# Patient Record
Sex: Male | Born: 1955 | Race: Black or African American | Hispanic: No | Marital: Single | Smoking: Former smoker
Health system: Southern US, Community
[De-identification: ages and names within clinical notes are randomized; demographics above are authoritative.]

## PROBLEM LIST (undated history)

## (undated) DIAGNOSIS — I480 Paroxysmal atrial fibrillation: Secondary | ICD-10-CM

## (undated) DIAGNOSIS — I5032 Chronic diastolic (congestive) heart failure: Secondary | ICD-10-CM

## (undated) DIAGNOSIS — Z952 Presence of prosthetic heart valve: Secondary | ICD-10-CM

## (undated) DIAGNOSIS — I82409 Acute embolism and thrombosis of unspecified deep veins of unspecified lower extremity: Secondary | ICD-10-CM

## (undated) DIAGNOSIS — N400 Enlarged prostate without lower urinary tract symptoms: Secondary | ICD-10-CM

## (undated) DIAGNOSIS — Z8679 Personal history of other diseases of the circulatory system: Secondary | ICD-10-CM

## (undated) DIAGNOSIS — Z794 Long term (current) use of insulin: Secondary | ICD-10-CM

## (undated) DIAGNOSIS — I639 Cerebral infarction, unspecified: Secondary | ICD-10-CM

## (undated) DIAGNOSIS — IMO0001 Reserved for inherently not codable concepts without codable children: Secondary | ICD-10-CM

## (undated) DIAGNOSIS — E119 Type 2 diabetes mellitus without complications: Secondary | ICD-10-CM

## (undated) DIAGNOSIS — N189 Chronic kidney disease, unspecified: Secondary | ICD-10-CM

---

## 2017-10-31 DIAGNOSIS — I639 Cerebral infarction, unspecified: Secondary | ICD-10-CM

## 2017-10-31 HISTORY — DX: Cerebral infarction, unspecified: I63.9

## 2017-10-31 HISTORY — PX: AORTIC VALVE REPLACEMENT: SHX41

## 2017-10-31 HISTORY — PX: THORACOABDOMINAL AORTIC ANEURYSM REPAIR: SHX2504

## 2018-08-07 ENCOUNTER — Encounter (HOSPITAL_COMMUNITY): Payer: Self-pay | Admitting: *Deleted

## 2018-08-07 ENCOUNTER — Emergency Department (HOSPITAL_COMMUNITY)
Admission: EM | Admit: 2018-08-07 | Discharge: 2018-08-07 | Disposition: A | Discharge status: Home / Self Care | Payer: Medicaid Other | Attending: Emergency Medicine | Admitting: Emergency Medicine

## 2018-08-07 ENCOUNTER — Emergency Department (HOSPITAL_COMMUNITY): Payer: Medicaid Other

## 2018-08-07 DIAGNOSIS — Z8673 Personal history of transient ischemic attack (TIA), and cerebral infarction without residual deficits: Secondary | ICD-10-CM | POA: Diagnosis not present

## 2018-08-07 DIAGNOSIS — Z7901 Long term (current) use of anticoagulants: Secondary | ICD-10-CM | POA: Insufficient documentation

## 2018-08-07 DIAGNOSIS — R42 Dizziness and giddiness: Secondary | ICD-10-CM | POA: Diagnosis present

## 2018-08-07 DIAGNOSIS — Z7982 Long term (current) use of aspirin: Secondary | ICD-10-CM | POA: Diagnosis not present

## 2018-08-07 DIAGNOSIS — N189 Chronic kidney disease, unspecified: Secondary | ICD-10-CM

## 2018-08-07 DIAGNOSIS — Z794 Long term (current) use of insulin: Secondary | ICD-10-CM | POA: Insufficient documentation

## 2018-08-07 DIAGNOSIS — Z79899 Other long term (current) drug therapy: Secondary | ICD-10-CM | POA: Insufficient documentation

## 2018-08-07 DIAGNOSIS — R252 Cramp and spasm: Secondary | ICD-10-CM | POA: Diagnosis not present

## 2018-08-07 DIAGNOSIS — R739 Hyperglycemia, unspecified: Secondary | ICD-10-CM

## 2018-08-07 DIAGNOSIS — E1165 Type 2 diabetes mellitus with hyperglycemia: Secondary | ICD-10-CM | POA: Diagnosis not present

## 2018-08-07 HISTORY — DX: Benign prostatic hyperplasia without lower urinary tract symptoms: N40.0

## 2018-08-07 HISTORY — DX: Type 2 diabetes mellitus without complications: E11.9

## 2018-08-07 HISTORY — DX: Long term (current) use of insulin: Z79.4

## 2018-08-07 HISTORY — DX: Chronic kidney disease, unspecified: N18.9

## 2018-08-07 HISTORY — DX: Reserved for inherently not codable concepts without codable children: IMO0001

## 2018-08-07 HISTORY — DX: Cerebral infarction, unspecified: I63.9

## 2018-08-07 LAB — I-STAT TROPONIN, ED: Troponin i, poc: 0.02 ng/mL (ref 0.00–0.08)

## 2018-08-07 LAB — CBC
HCT: 41.1 % (ref 39.0–52.0)
Hemoglobin: 12.6 g/dL — ABNORMAL LOW (ref 13.0–17.0)
MCH: 25.7 pg — ABNORMAL LOW (ref 26.0–34.0)
MCHC: 30.7 g/dL (ref 30.0–36.0)
MCV: 83.9 fL (ref 80.0–100.0)
Platelets: 214 10*3/uL (ref 150–400)
RBC: 4.9 MIL/uL (ref 4.22–5.81)
RDW: 12.8 % (ref 11.5–15.5)
WBC: 7.5 10*3/uL (ref 4.0–10.5)
nRBC: 0 % (ref 0.0–0.2)

## 2018-08-07 LAB — COMPREHENSIVE METABOLIC PANEL
ALT: 43 U/L (ref 0–44)
AST: 38 U/L (ref 15–41)
Albumin: 4.2 g/dL (ref 3.5–5.0)
Alkaline Phosphatase: 70 U/L (ref 38–126)
Anion gap: 12 (ref 5–15)
BUN: 34 mg/dL — ABNORMAL HIGH (ref 6–20)
CALCIUM: 9.8 mg/dL (ref 8.9–10.3)
CO2: 21 mmol/L — ABNORMAL LOW (ref 22–32)
CREATININE: 2.29 mg/dL — AB (ref 0.61–1.24)
Chloride: 94 mmol/L — ABNORMAL LOW (ref 98–111)
GFR calc Af Amer: 35 mL/min — ABNORMAL LOW (ref >60)
GFR, EST NON AFRICAN AMERICAN: 30 mL/min — AB (ref >60)
Glucose, Bld: 604 mg/dL (ref 70–99)
Potassium: 5.7 mmol/L — ABNORMAL HIGH (ref 3.5–5.1)
Sodium: 127 mmol/L — ABNORMAL LOW (ref 135–145)
Total Bilirubin: 1.2 mg/dL (ref 0.3–1.2)
Total Protein: 7.9 g/dL (ref 6.5–8.1)

## 2018-08-07 LAB — I-STAT VENOUS BLOOD GAS, ED
ACID-BASE EXCESS: 1 mmol/L (ref 0.0–2.0)
Bicarbonate: 28.2 mmol/L — ABNORMAL HIGH (ref 20.0–28.0)
O2 Saturation: 83 %
TCO2: 30 mmol/L (ref 22–32)
pCO2, Ven: 54.1 mmHg (ref 44.0–60.0)
pH, Ven: 7.325 (ref 7.250–7.430)
pO2, Ven: 52 mmHg — ABNORMAL HIGH (ref 32.0–45.0)

## 2018-08-07 LAB — DIFFERENTIAL
Abs Immature Granulocytes: 0.02 10*3/uL (ref 0.00–0.07)
Basophils Absolute: 0 10*3/uL (ref 0.0–0.1)
Basophils Relative: 0 %
Eosinophils Absolute: 0.2 10*3/uL (ref 0.0–0.5)
Eosinophils Relative: 2 %
Immature Granulocytes: 0 %
Lymphocytes Relative: 23 %
Lymphs Abs: 1.7 10*3/uL (ref 0.7–4.0)
Monocytes Absolute: 0.7 10*3/uL (ref 0.1–1.0)
Monocytes Relative: 9 %
Neutro Abs: 4.8 10*3/uL (ref 1.7–7.7)
Neutrophils Relative %: 66 %

## 2018-08-07 LAB — I-STAT CHEM 8, ED
BUN: 36 mg/dL — AB (ref 6–20)
BUN: 30 mg/dL — ABNORMAL HIGH (ref 6–20)
CREATININE: 2.1 mg/dL — AB (ref 0.61–1.24)
Calcium, Ion: 1.25 mmol/L (ref 1.15–1.40)
Calcium, Ion: 1.28 mmol/L (ref 1.15–1.40)
Chloride: 97 mmol/L — ABNORMAL LOW (ref 98–111)
Chloride: 101 mmol/L (ref 98–111)
Creatinine, Ser: 1.9 mg/dL — ABNORMAL HIGH (ref 0.61–1.24)
Glucose, Bld: 642 mg/dL (ref 70–99)
Glucose, Bld: 234 mg/dL — ABNORMAL HIGH (ref 70–99)
HCT: 44 % (ref 39.0–52.0)
HCT: 39 % (ref 39.0–52.0)
Hemoglobin: 15 g/dL (ref 13.0–17.0)
Hemoglobin: 13.3 g/dL (ref 13.0–17.0)
Potassium: 5.5 mmol/L — ABNORMAL HIGH (ref 3.5–5.1)
Potassium: 4.1 mmol/L (ref 3.5–5.1)
Sodium: 128 mmol/L — ABNORMAL LOW (ref 135–145)
Sodium: 137 mmol/L (ref 135–145)
TCO2: 24 mmol/L (ref 22–32)
TCO2: 27 mmol/L (ref 22–32)

## 2018-08-07 LAB — APTT: aPTT: 38 seconds — ABNORMAL HIGH (ref 24–36)

## 2018-08-07 LAB — PROTIME-INR
INR: 2.35
PROTHROMBIN TIME: 25.4 s — AB (ref 11.4–15.2)

## 2018-08-07 LAB — CBG MONITORING, ED
GLUCOSE-CAPILLARY: 226 mg/dL — AB (ref 70–99)
Glucose-Capillary: 571 mg/dL (ref 70–99)
Glucose-Capillary: 318 mg/dL — ABNORMAL HIGH (ref 70–99)

## 2018-08-07 MED ORDER — INSULIN ASPART 100 UNIT/ML ~~LOC~~ SOLN
5.0000 [IU] | Freq: Once | SUBCUTANEOUS | Status: AC
  Administered 2018-08-07: 5 [IU] via INTRAVENOUS

## 2018-08-07 MED ORDER — INSULIN ASPART 100 UNIT/ML ~~LOC~~ SOLN
10.0000 [IU] | Freq: Once | SUBCUTANEOUS | Status: AC
  Administered 2018-08-07: 10 [IU] via INTRAVENOUS

## 2018-08-07 MED ORDER — SODIUM CHLORIDE 0.9 % IV BOLUS
1000.0000 mL | Freq: Once | INTRAVENOUS | Status: AC
  Administered 2018-08-07: 1000 mL via INTRAVENOUS

## 2018-08-07 NOTE — ED Notes (Signed)
Pt has baseline blurry vision in left eye from a previous stroke - May 2019 Also left sided weakness from same.

## 2018-08-07 NOTE — ED Triage Notes (Signed)
Pt in stating he woke up this morning his right fingers were tingling, also having muscle spasms in his right chest and dizziness, symptoms were first noticed this morning, denies focal weakness

## 2018-08-07 NOTE — ED Provider Notes (Signed)
[facility] EMERGENCY DEPARTMENT Provider Note   CSN: [id number] Arrival date & time: 08/07/18  1530     History   Chief Complaint Chief Complaint  Patient presents with  . Dizziness    HPI Shane Murphy is a 63 y.o. male who presents with muscle cramping and paresthesias of the right hand. PMH significant for severe aortic stenosis s/p TAVR, PAF on Coumadin, CHF, HTN, thoracic ascending aortic aneurysm s/p repair, hx of CVA with residual left sided weakness and left eye blurry vision and gait abnormality, BPH, CKD. The patient is from [place] but is here for rehab. He states that he woke up this morning and had an acute onset of tingling in the right fingertips. He also has had intermittent muscle cramping. He missed last night's dose and this morning's dose of insulin. He has had polyuria and polydipsia. His CBG has been running high on his home monitor. He denies headache, dizziness, worsening of his left sided weakness, chest pain, SOB, abdominal pain, N/V. He denies paresthesias in other parts of his body. 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  HPI  Past Medical History:  Diagnosis Date  . BPH (benign prostatic hyperplasia)   . CKD (chronic kidney disease)   . Diabetes mellitus without complication ([place])   . Insulin dependent diabetes mellitus ([place])   . S/P TAVR (transcatheter aortic valve replacement)   . Stroke [facility])     There are no active problems to display for this patient.   History reviewed. No pertinent surgical history.      Home Medications    Prior to Admission medications   Medication Sig Start Date End Date Taking? Authorizing Provider  acetaminophen (TYLENOL) 500 MG tablet Take 500 mg by mouth every 6 (six) hours as needed (for pain or headaches).    Yes [provider]  aspirin 81 MG chewable tablet Chew 81 mg by mouth daily.   Yes [provider]  carvedilol (COREG) 3.125 MG tablet Take 3.125 mg  by mouth 2 (two) times daily with a meal.   Yes [provider]  furosemide (LASIX) 20 MG tablet Take 20 mg by mouth daily.   Yes [provider]  gabapentin (NEURONTIN) 100 MG capsule Take 200 mg by mouth 2 (two) times daily.   Yes [provider]  insulin lispro (HUMALOG KWIKPEN) 100 UNIT/ML KwikPen Inject 6 Units into the skin 2 (two) times daily as needed (for a BGL of 150 or greater).    Yes [provider]  rosuvastatin (CRESTOR) 10 MG tablet Take 10 mg by mouth at bedtime.   Yes [provider]  tamsulosin (FLOMAX) 0.4 MG CAPS capsule Take 0.4 mg by mouth at bedtime.   Yes [provider]  warfarin (COUMADIN) 10 MG tablet Take 10 mg by mouth at bedtime.   Yes [provider]    Family History History reviewed. No pertinent family history.  Social History Social History   Tobacco Use  . Smoking status: Never Smoker  . Smokeless tobacco: Never Used  Substance Use Topics  . Alcohol use: Not on file  . Drug use: Not on file     Allergies   Patient has no known allergies.   Review of Systems Review of Systems  Constitutional: Negative for fever.  Respiratory: Negative for shortness of breath.   Cardiovascular: Negative for chest pain.  Gastrointestinal: Negative for abdominal pain, nausea and vomiting.  Endocrine: Positive for polydipsia and  polyuria.  Musculoskeletal: Positive for myalgias.  Neurological: Positive for numbness. Negative for dizziness, tremors, weakness and headaches.  All other systems reviewed and are negative.    Physical Exam Updated Vital Signs BP (!) 142/80   Pulse 65   Temp 98.9 F (37.2 C) (Oral)   Resp 16   SpO2 100%   Physical Exam Vitals signs and nursing note reviewed.  Constitutional:      General: He is not in acute distress.    Appearance: Normal appearance. He is well-developed.     Comments: Calm and cooperative  HENT:     Head: Normocephalic and atraumatic.    Eyes:     General: No scleral icterus.       Right eye: No discharge.        Left eye: No discharge.     Conjunctiva/sclera: Conjunctivae normal.     Pupils: Pupils are equal, round, and reactive to light.  Neck:     Musculoskeletal: Normal range of motion.  Cardiovascular:     Rate and Rhythm: Normal rate and regular rhythm.     Comments: 2+radial pulses bilaterally Pulmonary:     Effort: Pulmonary effort is normal. No respiratory distress.     Breath sounds: Normal breath sounds.  Abdominal:     General: Abdomen is flat. There is no distension.     Palpations: Abdomen is soft.     Tenderness: There is abdominal tenderness (mild epigastric tenderness).  Skin:    General: Skin is warm and dry.     Coloration: Skin is not jaundiced or pale.  Neurological:     Mental Status: He is alert and oriented to person, place, and time.     Comments: Lying on stretcher in NAD. GCS 15. Speaks in a clear voice. Cranial nerves II through XII grossly intact. 5/5 strength right upper and right lower extremities. 4/5 strength in left upper and lower extremities. Subjective tingling of the right finger tips. Dysmetria noted on the left. Ambulatory    Psychiatric:        Behavior: Behavior normal.      ED Treatments / Results  Labs (all labs ordered are listed, but only abnormal results are displayed) Labs Reviewed  PROTIME-INR - Abnormal; Notable for the following components:      Result Value   Prothrombin Time 25.4 (*)    All other components within normal limits  APTT - Abnormal; Notable for the following components:   aPTT 38 (*)    All other components within normal limits  CBC - Abnormal; Notable for the following components:   Hemoglobin 12.6 (*)    MCH 25.7 (*)    All other components within normal limits  COMPREHENSIVE METABOLIC PANEL - Abnormal; Notable for the following components:   Sodium 127 (*)    Potassium 5.7 (*)    Chloride 94 (*)    CO2 21 (*)    Glucose, Bld 604  (*)    BUN 34 (*)    Creatinine, Ser 2.29 (*)    GFR calc non Af Amer 30 (*)    GFR calc Af Amer 35 (*)    All other components within normal limits  CBG MONITORING, ED - Abnormal; Notable for the following components:   Glucose-Capillary 571 (*)    All other components within normal limits  I-STAT CHEM 8, ED - Abnormal; Notable for the following components:   Sodium 128 (*)    Potassium 5.5 (*)    Chloride 97 (*)  BUN 36 (*)    Creatinine, Ser 2.10 (*)    Glucose, Bld 642 (*)    All other components within normal limits  CBG MONITORING, ED - Abnormal; Notable for the following components:   Glucose-Capillary 318 (*)    All other components within normal limits  DIFFERENTIAL  I-STAT TROPONIN, ED  CBG MONITORING, ED  I-STAT CHEM 8, ED    EKG EKG Interpretation  Date/Time:  Monday August 07 2018 15:47:30 EST Ventricular Rate:  86 PR Interval:  166 QRS Duration: 96 QT Interval:  378 QTC Calculation: 452 R Axis:   50 Text Interpretation:  Normal sinus rhythm Possible Left atrial enlargement Cannot rule out Anterior infarct , age undetermined Abnormal ECG no acute ischemic appearance, no old comparison Confirmed by Charlesetta Shanks [phone]) on 08/07/2018 11:44:57 PM   Radiology Ct Head Wo Contrast  Result Date: 08/07/2018 CLINICAL DATA:  Right upper extremity numbness.  Hyperglycemia. EXAM: CT HEAD WITHOUT CONTRAST TECHNIQUE: Contiguous axial images were obtained from the base of the skull through the vertex without intravenous contrast. COMPARISON:  None. FINDINGS: Brain: The ventricles and sulci appear within normal limits for age. There is no appreciable mass or hemorrhage. No extra-axial fluid or midline shift. There is evidence of a prior infarct involving much of the right occipital lobe as well as portions of the superior, medial temporal lobe. Along the periphery of this lesion near the superior temporal-parietal junction, there is less decreased attenuation than elsewhere  which may represent more recent infarct in this area. There is also decreased attenuation in the posterior limb of the right internal capsule which may represent an age uncertain and potentially more recent infarct. There is a small prior infarct in the medial left occipital lobe. There is slight small vessel disease in the centra semiovale bilaterally. Vascular: There is no appreciable hyperdense vessel. No vascular calcifications are evident. Skull: The bony calvarium a appears intact. Sinuses/Orbits: There is mucosal thickening in several ethmoid air cells. Other paranasal sinuses are clear. Orbits appear symmetric bilaterally. Other: Mastoid air cells are clear. There is debris in each external auditory canal. IMPRESSION: 1. Prior infarct involving much of the right occipital lobe as well as much of the superior right temporal lobe. There is questionable more recent and potentially acute infarct along the periphery of this lesion near the superior temporal-parietal junction on the right. There is also age uncertain potentially recent infarct involving a portion of the posterior limb of the right internal capsule. 2.  Prior much smaller infarct medial left occipital lobe. 3.  Mild periventricular small vessel disease. 4.  No appreciable hemorrhagic focus by CT. 5.  Mucosal thickening in several ethmoid air cells. 6.  Probable cerumen in each external auditory canal. Electronically Signed   By: Lowella Grip III M.D.   On: 08/07/2018 16:52    Procedures Procedures (including critical care time)  Medications Ordered in ED Medications  sodium chloride 0.9 % bolus 1,000 mL (1,000 mLs Intravenous New Bag/Given 08/07/18 1804)  insulin aspart (novoLOG) injection 10 Units (10 Units Intravenous Given 08/07/18 1805)     Initial Impression / Assessment and Plan / ED Course  I have reviewed the triage vital signs and the nursing notes.  Pertinent labs & imaging results that were available during my care of  the patient were reviewed by me and considered in my medical decision making (see chart for details).  63 year old male presents with paresthesias in the right hand and muscle cramping.  He has  marked hyperglycemia.  Glucose here is 652.  He is hypertensive but otherwise vital signs are normal.  Exam is unremarkable.  CBC is remarkable for mild anemia.  CMP is remarkable for mild hyperkalemia (5.7). The blood sample also does appear to be hemolyzed. He also has an elevated BUN and creatinine. On review of EMR this appears to be around his baseline. Anion gap is normal.  His troponin is normal.  EKG is SR. CT shows possible worsening acute infarct in the area where his stroke was previously seen. Will give fluids and insulin and talk with neurology.  7:40 PM Discussed with Dr. Leonel Ramsay with neurology. He does not think CT findings and symptoms correlate and recommends treating electrolyte imbalances and reassessing. If symptoms are still persistent, MRI can be obtained.  Reevaluated patient.  His repeat blood sugar is 318.  He feels completely better and back to his baseline. He ambulated to the bathroom. Discussed with patient about medication adherence. He verbalizes understanding. Since his potassium was slightly high we will repeat this and recheck his blood sugar.  Shared visit with Dr. Vallery Ridge. Will diso patient after Chem-8 results. Likely d/c.   Final Clinical Impressions(s) / ED Diagnoses   Final diagnoses:  Hyperglycemia  Chronic kidney disease, unspecified CKD stage  Muscle cramping    ED Discharge Orders    None       Recardo Evangelist, PA-C 08/10/18 1623    Charlesetta Shanks, MD 08/19/18 1946

## 2018-08-07 NOTE — Discharge Instructions (Addendum)
1.  Take your insulin as prescribed by your doctor.  Try to follow a regular meal plan.  Some instructions on diabetes and diet have been included in your discharge material. 2.  Call your doctor tomorrow morning to schedule a recheck this week. 3.  Return to the emergency department if problems or concerns arise.

## 2018-08-07 NOTE — ED Notes (Signed)
I-Stat Chem 8 abnormal results in Glucose of 642 reported to [employer], Vicente Males.

## 2018-09-06 ENCOUNTER — Emergency Department (HOSPITAL_COMMUNITY): Payer: Medicaid Other

## 2018-09-06 ENCOUNTER — Other Ambulatory Visit: Payer: Self-pay

## 2018-09-06 ENCOUNTER — Encounter (HOSPITAL_COMMUNITY): Payer: Self-pay | Admitting: Emergency Medicine

## 2018-09-06 ENCOUNTER — Inpatient Hospital Stay (HOSPITAL_COMMUNITY)
Admission: EM | Admit: 2018-09-06 | Discharge: 2018-09-09 | DRG: 683 | Disposition: A | Discharge status: Home / Self Care | Payer: Medicaid Other | Attending: Internal Medicine | Admitting: Internal Medicine

## 2018-09-06 DIAGNOSIS — G8194 Hemiplegia, unspecified affecting left nondominant side: Secondary | ICD-10-CM

## 2018-09-06 DIAGNOSIS — Z6828 Body mass index (BMI) 28.0-28.9, adult: Secondary | ICD-10-CM

## 2018-09-06 DIAGNOSIS — N400 Enlarged prostate without lower urinary tract symptoms: Secondary | ICD-10-CM | POA: Diagnosis present

## 2018-09-06 DIAGNOSIS — IMO0001 Reserved for inherently not codable concepts without codable children: Secondary | ICD-10-CM

## 2018-09-06 DIAGNOSIS — E119 Type 2 diabetes mellitus without complications: Secondary | ICD-10-CM

## 2018-09-06 DIAGNOSIS — Z7982 Long term (current) use of aspirin: Secondary | ICD-10-CM

## 2018-09-06 DIAGNOSIS — N184 Chronic kidney disease, stage 4 (severe): Secondary | ICD-10-CM | POA: Diagnosis present

## 2018-09-06 DIAGNOSIS — I69354 Hemiplegia and hemiparesis following cerebral infarction affecting left non-dominant side: Secondary | ICD-10-CM

## 2018-09-06 DIAGNOSIS — Z86718 Personal history of other venous thrombosis and embolism: Secondary | ICD-10-CM

## 2018-09-06 DIAGNOSIS — E1122 Type 2 diabetes mellitus with diabetic chronic kidney disease: Secondary | ICD-10-CM | POA: Diagnosis present

## 2018-09-06 DIAGNOSIS — I5082 Biventricular heart failure: Secondary | ICD-10-CM | POA: Diagnosis present

## 2018-09-06 DIAGNOSIS — I480 Paroxysmal atrial fibrillation: Secondary | ICD-10-CM | POA: Diagnosis present

## 2018-09-06 DIAGNOSIS — Z87891 Personal history of nicotine dependence: Secondary | ICD-10-CM

## 2018-09-06 DIAGNOSIS — I5032 Chronic diastolic (congestive) heart failure: Secondary | ICD-10-CM | POA: Diagnosis present

## 2018-09-06 DIAGNOSIS — Z95828 Presence of other vascular implants and grafts: Secondary | ICD-10-CM

## 2018-09-06 DIAGNOSIS — I69398 Other sequelae of cerebral infarction: Secondary | ICD-10-CM

## 2018-09-06 DIAGNOSIS — H538 Other visual disturbances: Secondary | ICD-10-CM | POA: Diagnosis present

## 2018-09-06 DIAGNOSIS — Z794 Long term (current) use of insulin: Secondary | ICD-10-CM

## 2018-09-06 DIAGNOSIS — R791 Abnormal coagulation profile: Secondary | ICD-10-CM

## 2018-09-06 DIAGNOSIS — R627 Adult failure to thrive: Secondary | ICD-10-CM | POA: Diagnosis present

## 2018-09-06 DIAGNOSIS — N179 Acute kidney failure, unspecified: Principal | ICD-10-CM | POA: Diagnosis present

## 2018-09-06 DIAGNOSIS — R29810 Facial weakness: Secondary | ICD-10-CM | POA: Diagnosis present

## 2018-09-06 DIAGNOSIS — Z8249 Family history of ischemic heart disease and other diseases of the circulatory system: Secondary | ICD-10-CM

## 2018-09-06 DIAGNOSIS — I130 Hypertensive heart and chronic kidney disease with heart failure and stage 1 through stage 4 chronic kidney disease, or unspecified chronic kidney disease: Secondary | ICD-10-CM | POA: Diagnosis present

## 2018-09-06 DIAGNOSIS — Z7901 Long term (current) use of anticoagulants: Secondary | ICD-10-CM

## 2018-09-06 DIAGNOSIS — I10 Essential (primary) hypertension: Secondary | ICD-10-CM | POA: Diagnosis present

## 2018-09-06 DIAGNOSIS — Z823 Family history of stroke: Secondary | ICD-10-CM

## 2018-09-06 DIAGNOSIS — Z953 Presence of xenogenic heart valve: Secondary | ICD-10-CM

## 2018-09-06 DIAGNOSIS — E860 Dehydration: Secondary | ICD-10-CM | POA: Diagnosis present

## 2018-09-06 DIAGNOSIS — N189 Chronic kidney disease, unspecified: Secondary | ICD-10-CM

## 2018-09-06 DIAGNOSIS — R531 Weakness: Secondary | ICD-10-CM

## 2018-09-06 HISTORY — DX: Personal history of other diseases of the circulatory system: Z86.79

## 2018-09-06 HISTORY — DX: Presence of prosthetic heart valve: Z95.2

## 2018-09-06 HISTORY — DX: Chronic diastolic (congestive) heart failure: I50.32

## 2018-09-06 HISTORY — DX: Acute embolism and thrombosis of unspecified deep veins of unspecified lower extremity: I82.409

## 2018-09-06 HISTORY — DX: Paroxysmal atrial fibrillation: I48.0

## 2018-09-06 LAB — I-STAT TROPONIN, ED: Troponin i, poc: 0.02 ng/mL (ref 0.00–0.08)

## 2018-09-06 MED ORDER — TETRACAINE HCL 0.5 % OP SOLN
2.0000 [drp] | Freq: Once | OPHTHALMIC | Status: AC
  Administered 2018-09-07: 2 [drp] via OPHTHALMIC
  Filled 2018-09-06: qty 4

## 2018-09-06 MED ORDER — FLUORESCEIN SODIUM 1 MG OP STRP
1.0000 | ORAL_STRIP | Freq: Once | OPHTHALMIC | Status: AC
  Administered 2018-09-07: 1 via OPHTHALMIC
  Filled 2018-09-06: qty 1

## 2018-09-06 NOTE — ED Notes (Signed)
Patient transported to X-ray, CT 

## 2018-09-06 NOTE — ED Triage Notes (Signed)
Pt reports new worsening weakness to L side. Hx of CVA with L sided weakness, but pt states worse than baseline. C/o dizziness, eye pain as well. Incr'd ataxia in L arm. Last known well >24hrs ago.

## 2018-09-06 NOTE — ED Provider Notes (Signed)
[facility] EMERGENCY DEPARTMENT Provider Note   CSN: [id number] Arrival date & time: 09/06/18  2306     History   Chief Complaint No chief complaint on file.   HPI Shane Murphy is a 63 y.o. male.  PMH significant for severe aortic stenosis s/p TAVR, PAF on Coumadin, CHF, HTN, thoracic ascending aortic aneurysm s/p repair, hx of CVA with residual left sided weakness and left eye blurry vision and gait abnormality, BPH, CKD presenting with increased weakness on his left side, last seen normal 24 hours ago.  Patient reports increased weakness to his left face, arm and leg that he noticed today associated with generalized weakness and dizziness.  Also complains of bilateral eye pain and no.  Denies any visual changes or change in his chronic left eye blurriness.  no chest pain or shortness of breath.  No abdominal pain, nausea or vomiting.  No fever, runny nose, sore throat, cough or congestion.  States compliance with his medications.  His daughter noticed that he had a morning facial droop that she had not appreciated in the past. Patient states compliance with his Coumadin.  States his left eye vision is normal but he said both of his eyes hurt.  Patient believes symptoms are due to receiving pneumonia shot 2 days ago.  The history is provided by the patient and a relative.    Past Medical History:  Diagnosis Date  . BPH (benign prostatic hyperplasia)   . CKD (chronic kidney disease)   . Diabetes mellitus without complication ([place])   . Insulin dependent diabetes mellitus ([place])   . S/P TAVR (transcatheter aortic valve replacement)   . Stroke [facility]     There are no active problems to display for this patient.   No past surgical history on file.      Home Medications    Prior to Admission medications   Medication Sig Start Date End Date Taking? Authorizing Provider  acetaminophen (TYLENOL) 500 MG tablet Take 500 mg by mouth every 6 (six) hours as needed  (for pain or headaches).     [provider]  aspirin 81 MG chewable tablet Chew 81 mg by mouth daily.    [provider]  carvedilol (COREG) 3.125 MG tablet Take 3.125 mg by mouth 2 (two) times daily with a meal.    [provider]  furosemide (LASIX) 20 MG tablet Take 20 mg by mouth daily.    [provider]  gabapentin (NEURONTIN) 100 MG capsule Take 200 mg by mouth 2 (two) times daily.    [provider]  insulin lispro (HUMALOG KWIKPEN) 100 UNIT/ML KwikPen Inject 6 Units into the skin 2 (two) times daily as needed (for a BGL of 150 or greater).     [provider]  rosuvastatin (CRESTOR) 10 MG tablet Take 10 mg by mouth at bedtime.    [provider]  tamsulosin (FLOMAX) 0.4 MG CAPS capsule Take 0.4 mg by mouth at bedtime.    [provider]  warfarin (COUMADIN) 10 MG tablet Take 10 mg by mouth at bedtime.    [provider]    Family History No family history on file.  Social History Social History   Tobacco Use  . Smoking status: Never Smoker  . Smokeless tobacco: Never Used  Substance Use Topics  . Alcohol use: Not on file  . Drug use: Not on file     Allergies   Patient has no known allergies.  Review of Systems Review of Systems  Constitutional: Negative for activity change, appetite change and fever.  HENT: Negative for congestion.   Eyes: Positive for visual disturbance.  Respiratory: Negative for cough and shortness of breath.   Cardiovascular: Negative for chest pain.  Gastrointestinal: Negative for abdominal pain, nausea and vomiting.  Genitourinary: Negative for dysuria and hematuria.  Musculoskeletal: Negative for back pain.  Neurological: Positive for dizziness, facial asymmetry, weakness, light-headedness, numbness and headaches. Negative for speech difficulty.    all other systems are negative except as noted in the HPI and PMH.    Physical Exam Updated Vital Signs BP  (!) 110/56   Pulse 77   Temp 99.3 F (37.4 C) (Oral)   Resp 19   Ht 6' (1.829 m)   Wt 96.2 kg   SpO2 98%   BMI 28.75 kg/m   Physical Exam Vitals signs and nursing note reviewed.  Constitutional:      General: He is not in acute distress.    Appearance: He is well-developed.  HENT:     Head: Normocephalic and atraumatic.     Right Ear: Tympanic membrane normal.     Left Ear: Tympanic membrane normal.     Mouth/Throat:     Pharynx: No oropharyngeal exudate.  Eyes:     Conjunctiva/sclera: Conjunctivae normal.     Pupils: Pupils are equal, round, and reactive to light.     Comments: Fluorescein stain negative.  Seidel sign negative.  Intraocular pressures 22 bilaterally  Neck:     Musculoskeletal: Normal range of motion and neck supple.     Comments: No meningismus. Cardiovascular:     Rate and Rhythm: Normal rate and regular rhythm.     Heart sounds: Normal heart sounds. No murmur.  Pulmonary:     Effort: Pulmonary effort is normal. No respiratory distress.     Breath sounds: Normal breath sounds.     Comments: Well healed surgical incision Chest:     Chest wall: No tenderness.  Abdominal:     Palpations: Abdomen is soft.     Tenderness: There is no abdominal tenderness. There is no guarding or rebound.  Musculoskeletal: Normal range of motion.        General: No tenderness.  Skin:    General: Skin is warm.     Capillary Refill: Capillary refill takes less than 2 seconds.  Neurological:     Mental Status: He is alert and oriented to person, place, and time.     Cranial Nerves: Cranial nerve deficit present.     Motor: Weakness present. No abnormal muscle tone.     Coordination: Coordination abnormal.     Comments: Left-sided facial droop, asymmetric smile.  Tongue is midline.  There is greater drift of the left arm with ataxia on finger-to-nose on the left.  4/5 strength of left upper extremity and left lower extremity.  5/5 strength of right arm and leg. Visual  fields full to confrontation.  Patient reports blurry vision in his left eye that is unchanged  Psychiatric:        Behavior: Behavior normal.      ED Treatments / Results  Labs (all labs ordered are listed, but only abnormal results are displayed) Labs Reviewed  PROTIME-INR - Abnormal; Notable for the following components:      Result Value   Prothrombin Time 31.4 (*)    All other components within normal limits  APTT - Abnormal; Notable for the following components:   aPTT 49 (*)  All other components within normal limits  CBC - Abnormal; Notable for the following components:   WBC 14.2 (*)    Hemoglobin 10.9 (*)    HCT 36.4 (*)    MCH 25.3 (*)    MCHC 29.9 (*)    All other components within normal limits  DIFFERENTIAL - Abnormal; Notable for the following components:   Neutro Abs 11.4 (*)    Monocytes Absolute 1.4 (*)    Abs Immature Granulocytes 0.13 (*)    All other components within normal limits  COMPREHENSIVE METABOLIC PANEL - Abnormal; Notable for the following components:   Sodium 134 (*)    Glucose, Bld 141 (*)    BUN 42 (*)    Creatinine, Ser 3.94 (*)    AST 97 (*)    ALT 102 (*)    GFR calc non Af Amer 16 (*)    GFR calc Af Amer 18 (*)    All other components within normal limits  PROTIME-INR - Abnormal; Notable for the following components:   Prothrombin Time 33.9 (*)    All other components within normal limits  CBC - Abnormal; Notable for the following components:   WBC 13.8 (*)    RBC 3.99 (*)    Hemoglobin 10.4 (*)    HCT 34.1 (*)    All other components within normal limits  BASIC METABOLIC PANEL - Abnormal; Notable for the following components:   CO2 20 (*)    Glucose, Bld 129 (*)    BUN 40 (*)    Creatinine, Ser 3.67 (*)    GFR calc non Af Amer 17 (*)    GFR calc Af Amer 20 (*)    All other components within normal limits  ETHANOL  RAPID URINE DRUG SCREEN, HOSP PERFORMED  URINALYSIS, ROUTINE W REFLEX MICROSCOPIC  CREATININE, URINE,  RANDOM  NA AND K (SODIUM & POTASSIUM), RAND UR  INFLUENZA PANEL BY PCR (TYPE A & B)  HIV ANTIBODY (ROUTINE TESTING W REFLEX)  I-STAT TROPONIN, ED  POC OCCULT BLOOD, ED    EKG EKG Interpretation  Date/Time:  Wednesday September 06 2018 23:19:06 EST Ventricular Rate:  84 PR Interval:    QRS Duration: 92 QT Interval:  346 QTC Calculation: 409 R Axis:   51 Text Interpretation:  Sinus rhythm Atrial premature complex Nonspecific T abnormalities, lateral leads No significant change was found Confirmed by Ezequiel Essex [phone]) on 09/06/2018 11:59:29 PM Also confirmed by Ezequiel Essex [phone]), editor Philomena Doheny [phone])  on 09/07/2018 7:04:59 AM   Radiology Dg Chest 2 View  Result Date: 09/06/2018 CLINICAL DATA:  Weakness after getting flu shot. EXAM: CHEST - 2 VIEW COMPARISON:  None. FINDINGS: Postoperative changes in the mediastinum. Surgical clips in the right upper chest. Postoperative cerclage wire with old fracture deformity of the distal left clavicle. Heart size and pulmonary vascularity are normal. Lungs appear clear and expanded. No airspace disease or consolidation. No blunting of costophrenic angles. No pneumothorax. Mediastinal contours appear intact. Degenerative changes in the spine. IMPRESSION: No evidence of active pulmonary disease. Electronically Signed   By: Lucienne Capers M.D.   On: 09/06/2018 23:53   Ct Head Wo Contrast  Result Date: 09/07/2018 CLINICAL DATA:  Worsening weakness of the left side EXAM: CT HEAD WITHOUT CONTRAST TECHNIQUE: Contiguous axial images were obtained from the base of the skull through the vertex without intravenous contrast. COMPARISON:  08/07/2018 FINDINGS: Brain: No evidence of acute infarction, hemorrhage, hydrocephalus, extra-axial collection or mass lesion/mass effect. Old right  MCA territory infarct with encephalomalacia. Old small left occipital lobe infarct. Vascular: No hyperdense vessel or unexpected calcification. Skull: No osseous  abnormality. Sinuses/Orbits: Visualized paranasal sinuses are clear. Visualized mastoid sinuses are clear. Visualized orbits demonstrate no focal abnormality. Other: None IMPRESSION: No acute intracranial pathology. Electronically Signed   By: Kathreen Devoid   On: 09/07/2018 00:13   Mr Brain Wo Contrast  Result Date: 09/07/2018 CLINICAL DATA:  Initial evaluation for increased left-sided weakness. EXAM: MRI HEAD WITHOUT CONTRAST TECHNIQUE: Multiplanar, multiecho pulse sequences of the brain and surrounding structures were obtained without intravenous contrast. COMPARISON:  Prior CT from 09/06/2018 FINDINGS: Brain: Generalized age-related cerebral atrophy. Patchy T2/FLAIR hyperintensity within the periventricular deep white matter both cerebral hemispheres, most like related chronic microvascular disease, mild in nature. Extensive encephalomalacia with gliosis seen involving the right frontal and parietal lobes as well as the right temporal occipital region, compatible with remote right MCA-MCA/PCA watershed territory infarcts. Additional smaller cortical infarct noted at the left occipital lobe, compatible with remote left PCA territory infarct. Scattered chronic hemosiderin staining within these regions compatible with chronic blood products and/or laminar necrosis. Associated wallerian degeneration at the right cerebral peduncle extending into the brainstem. No abnormal foci of restricted diffusion to suggest acute or subacute ischemia. Gray-white matter differentiation otherwise maintained. No evidence for acute intracranial hemorrhage. No mass lesion, midline shift or mass effect. No hydrocephalus. No extra-axial fluid collection. Pituitary gland within normal limits. Vascular: Major intracranial vascular flow voids maintained. Skull and upper cervical spine: Craniocervical junction normal. Bone marrow signal intensity within normal limits. No scalp soft tissue abnormality. Sinuses/Orbits: Globes and orbital  soft tissues within normal limits. Paranasal sinuses are largely clear. Trace opacity bilateral mastoid air cells, of doubtful significance. Inner ear structures grossly normal. Other: None. IMPRESSION: 1. No acute intracranial abnormality. 2. 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 3. Underlying age-related cerebral atrophy with mild chronic small vessel ischemic disease. Electronically Signed   By: Jeannine Boga M.D.   On: 09/07/2018 01:54   US Renal  Result Date: 09/07/2018 CLINICAL DATA:  Acute renal failure EXAM: RENAL / URINARY TRACT ULTRASOUND COMPLETE COMPARISON:  None. FINDINGS: Right Kidney: Renal measurements: 12.3 x 4.1 x 5.4 cm = volume: 146 mL . Echogenicity within normal limits. No mass or hydronephrosis visualized. Left Kidney: Renal measurements: 11.3 x 6 x 5.9 cm = volume: 212 mL. Echogenicity within normal limits. No mass or hydronephrosis visualized. Bladder: No bladder wall thickening or filling defects. Enlarged and lobular appearance of the prostate gland. Prostate measures 5.6 x 5 x 3.9 cm. Prostatic impression on the bladder base. IMPRESSION: Normal ultrasound appearance of the kidneys. No hydronephrosis. Normal appearance of the bladder. Enlarged prostate gland. Electronically Signed   By: Lucienne Capers M.D.   On: 09/07/2018 03:48    Procedures Procedures (including critical care time)  Medications Ordered in ED Medications  fluorescein ophthalmic strip 1 strip (has no administration in time range)  tetracaine (PONTOCAINE) 0.5 % ophthalmic solution 2 drop (has no administration in time range)     Initial Impression / Assessment and Plan / ED Course  I have reviewed the triage vital signs and the nursing notes.  Pertinent labs & imaging results that were available during my care of the patient were reviewed by me and considered in my medical decision making (see chart for details).    Patient with worsening left-sided weakness.  Last seen normal  last night.  Code stroke not activated due to delay  in presentation.  CT head shows no acute pathology.  Labs show AKI with worsening anemia.  Patient is on Coumadin.  Hemoccult is negative.  Patient will be hydrated gently.  Discussed with Dr. Leonel Ramsay of neurology.  He feels the patient's MRI is negative he does not need further neurological work-up.  Suspect recrudescence of previous stroke symptoms from AKI and recent viral illness.  MRI is negative for acute stroke.  Patient will be admitted for acute renal failure and anemia.  He is Hemoccult negative.  Admission discussed with Dr. Alcario Drought.  Final Clinical Impressions(s) / ED Diagnoses   Final diagnoses:  Left-sided weakness  AKI (acute kidney injury) [facility])    ED Discharge Orders    None       Ezequiel Essex, MD 09/07/18 [phone]

## 2018-09-07 ENCOUNTER — Encounter (HOSPITAL_COMMUNITY): Payer: Self-pay | Admitting: Radiology

## 2018-09-07 ENCOUNTER — Emergency Department (HOSPITAL_COMMUNITY): Payer: Medicaid Other

## 2018-09-07 ENCOUNTER — Inpatient Hospital Stay (HOSPITAL_COMMUNITY): Payer: Medicaid Other

## 2018-09-07 DIAGNOSIS — I10 Essential (primary) hypertension: Secondary | ICD-10-CM | POA: Diagnosis not present

## 2018-09-07 DIAGNOSIS — Z6828 Body mass index (BMI) 28.0-28.9, adult: Secondary | ICD-10-CM | POA: Diagnosis not present

## 2018-09-07 DIAGNOSIS — IMO0001 Reserved for inherently not codable concepts without codable children: Secondary | ICD-10-CM

## 2018-09-07 DIAGNOSIS — Z95828 Presence of other vascular implants and grafts: Secondary | ICD-10-CM | POA: Diagnosis not present

## 2018-09-07 DIAGNOSIS — G8194 Hemiplegia, unspecified affecting left nondominant side: Secondary | ICD-10-CM | POA: Diagnosis not present

## 2018-09-07 DIAGNOSIS — I69354 Hemiplegia and hemiparesis following cerebral infarction affecting left non-dominant side: Secondary | ICD-10-CM | POA: Diagnosis not present

## 2018-09-07 DIAGNOSIS — R627 Adult failure to thrive: Secondary | ICD-10-CM

## 2018-09-07 DIAGNOSIS — Z794 Long term (current) use of insulin: Secondary | ICD-10-CM

## 2018-09-07 DIAGNOSIS — I130 Hypertensive heart and chronic kidney disease with heart failure and stage 1 through stage 4 chronic kidney disease, or unspecified chronic kidney disease: Secondary | ICD-10-CM | POA: Diagnosis present

## 2018-09-07 DIAGNOSIS — N179 Acute kidney failure, unspecified: Secondary | ICD-10-CM | POA: Diagnosis present

## 2018-09-07 DIAGNOSIS — Z8249 Family history of ischemic heart disease and other diseases of the circulatory system: Secondary | ICD-10-CM | POA: Diagnosis not present

## 2018-09-07 DIAGNOSIS — E1122 Type 2 diabetes mellitus with diabetic chronic kidney disease: Secondary | ICD-10-CM | POA: Diagnosis present

## 2018-09-07 DIAGNOSIS — I639 Cerebral infarction, unspecified: Secondary | ICD-10-CM

## 2018-09-07 DIAGNOSIS — I5032 Chronic diastolic (congestive) heart failure: Secondary | ICD-10-CM | POA: Diagnosis present

## 2018-09-07 DIAGNOSIS — Z7982 Long term (current) use of aspirin: Secondary | ICD-10-CM | POA: Diagnosis not present

## 2018-09-07 DIAGNOSIS — R29810 Facial weakness: Secondary | ICD-10-CM | POA: Diagnosis present

## 2018-09-07 DIAGNOSIS — N170 Acute kidney failure with tubular necrosis: Secondary | ICD-10-CM | POA: Diagnosis not present

## 2018-09-07 DIAGNOSIS — N400 Enlarged prostate without lower urinary tract symptoms: Secondary | ICD-10-CM | POA: Diagnosis present

## 2018-09-07 DIAGNOSIS — Z86718 Personal history of other venous thrombosis and embolism: Secondary | ICD-10-CM | POA: Diagnosis not present

## 2018-09-07 DIAGNOSIS — N183 Chronic kidney disease, stage 3 (moderate): Secondary | ICD-10-CM

## 2018-09-07 DIAGNOSIS — H538 Other visual disturbances: Secondary | ICD-10-CM | POA: Diagnosis present

## 2018-09-07 DIAGNOSIS — Z953 Presence of xenogenic heart valve: Secondary | ICD-10-CM | POA: Diagnosis not present

## 2018-09-07 DIAGNOSIS — N184 Chronic kidney disease, stage 4 (severe): Secondary | ICD-10-CM

## 2018-09-07 DIAGNOSIS — Z7901 Long term (current) use of anticoagulants: Secondary | ICD-10-CM | POA: Diagnosis not present

## 2018-09-07 DIAGNOSIS — E119 Type 2 diabetes mellitus without complications: Secondary | ICD-10-CM | POA: Diagnosis not present

## 2018-09-07 DIAGNOSIS — R791 Abnormal coagulation profile: Secondary | ICD-10-CM

## 2018-09-07 DIAGNOSIS — Z87891 Personal history of nicotine dependence: Secondary | ICD-10-CM | POA: Diagnosis not present

## 2018-09-07 DIAGNOSIS — N189 Chronic kidney disease, unspecified: Secondary | ICD-10-CM

## 2018-09-07 DIAGNOSIS — E860 Dehydration: Secondary | ICD-10-CM | POA: Diagnosis present

## 2018-09-07 DIAGNOSIS — I480 Paroxysmal atrial fibrillation: Secondary | ICD-10-CM | POA: Diagnosis present

## 2018-09-07 DIAGNOSIS — I69398 Other sequelae of cerebral infarction: Secondary | ICD-10-CM | POA: Diagnosis not present

## 2018-09-07 DIAGNOSIS — I5082 Biventricular heart failure: Secondary | ICD-10-CM | POA: Diagnosis present

## 2018-09-07 DIAGNOSIS — R531 Weakness: Secondary | ICD-10-CM | POA: Diagnosis present

## 2018-09-07 DIAGNOSIS — Z823 Family history of stroke: Secondary | ICD-10-CM | POA: Diagnosis not present

## 2018-09-07 LAB — COMPREHENSIVE METABOLIC PANEL
ALBUMIN: 3.6 g/dL (ref 3.5–5.0)
ALT: 102 U/L — ABNORMAL HIGH (ref 0–44)
AST: 97 U/L — ABNORMAL HIGH (ref 15–41)
Alkaline Phosphatase: 83 U/L (ref 38–126)
Anion gap: 9 (ref 5–15)
BILIRUBIN TOTAL: 0.8 mg/dL (ref 0.3–1.2)
BUN: 42 mg/dL — ABNORMAL HIGH (ref 6–20)
CALCIUM: 9.3 mg/dL (ref 8.9–10.3)
CO2: 22 mmol/L (ref 22–32)
Chloride: 103 mmol/L (ref 98–111)
Creatinine, Ser: 3.94 mg/dL — ABNORMAL HIGH (ref 0.61–1.24)
GFR calc Af Amer: 18 mL/min — ABNORMAL LOW (ref >60)
GFR calc non Af Amer: 16 mL/min — ABNORMAL LOW (ref >60)
Glucose, Bld: 141 mg/dL — ABNORMAL HIGH (ref 70–99)
Potassium: 4.5 mmol/L (ref 3.5–5.1)
Sodium: 134 mmol/L — ABNORMAL LOW (ref 135–145)
Total Protein: 7.1 g/dL (ref 6.5–8.1)

## 2018-09-07 LAB — CBC
HCT: 34.1 % — ABNORMAL LOW (ref 39.0–52.0)
HCT: 36.4 % — ABNORMAL LOW (ref 39.0–52.0)
HEMOGLOBIN: 10.4 g/dL — AB (ref 13.0–17.0)
Hemoglobin: 10.9 g/dL — ABNORMAL LOW (ref 13.0–17.0)
MCH: 25.3 pg — ABNORMAL LOW (ref 26.0–34.0)
MCH: 26.1 pg (ref 26.0–34.0)
MCHC: 29.9 g/dL — ABNORMAL LOW (ref 30.0–36.0)
MCHC: 30.5 g/dL (ref 30.0–36.0)
MCV: 84.5 fL (ref 80.0–100.0)
MCV: 85.5 fL (ref 80.0–100.0)
NRBC: 0 % (ref 0.0–0.2)
Platelets: 167 10*3/uL (ref 150–400)
Platelets: 184 10*3/uL (ref 150–400)
RBC: 3.99 MIL/uL — ABNORMAL LOW (ref 4.22–5.81)
RBC: 4.31 MIL/uL (ref 4.22–5.81)
RDW: 13.3 % (ref 11.5–15.5)
RDW: 13.6 % (ref 11.5–15.5)
WBC: 13.8 10*3/uL — ABNORMAL HIGH (ref 4.0–10.5)
WBC: 14.2 10*3/uL — ABNORMAL HIGH (ref 4.0–10.5)
nRBC: 0 % (ref 0.0–0.2)

## 2018-09-07 LAB — URINALYSIS, ROUTINE W REFLEX MICROSCOPIC
Bilirubin Urine: NEGATIVE
Glucose, UA: NEGATIVE mg/dL
HGB URINE DIPSTICK: NEGATIVE
Ketones, ur: NEGATIVE mg/dL
Leukocytes, UA: NEGATIVE
Nitrite: NEGATIVE
Protein, ur: NEGATIVE mg/dL
Specific Gravity, Urine: 1.01 (ref 1.005–1.030)
pH: 5 (ref 5.0–8.0)

## 2018-09-07 LAB — DIFFERENTIAL
Abs Immature Granulocytes: 0.13 10*3/uL — ABNORMAL HIGH (ref 0.00–0.07)
BASOS ABS: 0 10*3/uL (ref 0.0–0.1)
Basophils Relative: 0 %
Eosinophils Absolute: 0 10*3/uL (ref 0.0–0.5)
Eosinophils Relative: 0 %
IMMATURE GRANULOCYTES: 1 %
Lymphocytes Relative: 9 %
Lymphs Abs: 1.2 10*3/uL (ref 0.7–4.0)
Monocytes Absolute: 1.4 10*3/uL — ABNORMAL HIGH (ref 0.1–1.0)
Monocytes Relative: 10 %
Neutro Abs: 11.4 10*3/uL — ABNORMAL HIGH (ref 1.7–7.7)
Neutrophils Relative %: 80 %

## 2018-09-07 LAB — BASIC METABOLIC PANEL
Anion gap: 13 (ref 5–15)
BUN: 40 mg/dL — ABNORMAL HIGH (ref 6–20)
CHLORIDE: 103 mmol/L (ref 98–111)
CO2: 20 mmol/L — ABNORMAL LOW (ref 22–32)
Calcium: 9.1 mg/dL (ref 8.9–10.3)
Creatinine, Ser: 3.67 mg/dL — ABNORMAL HIGH (ref 0.61–1.24)
GFR calc Af Amer: 20 mL/min — ABNORMAL LOW (ref >60)
GFR calc non Af Amer: 17 mL/min — ABNORMAL LOW (ref >60)
Glucose, Bld: 129 mg/dL — ABNORMAL HIGH (ref 70–99)
Potassium: 4.2 mmol/L (ref 3.5–5.1)
Sodium: 136 mmol/L (ref 135–145)

## 2018-09-07 LAB — RAPID URINE DRUG SCREEN, HOSP PERFORMED
Amphetamines: NOT DETECTED
BENZODIAZEPINES: NOT DETECTED
Barbiturates: NOT DETECTED
COCAINE: NOT DETECTED
Opiates: NOT DETECTED
Tetrahydrocannabinol: NOT DETECTED

## 2018-09-07 LAB — NA AND K (SODIUM & POTASSIUM), RAND UR
Potassium Urine: 26 mmol/L
Sodium, Ur: 24 mmol/L

## 2018-09-07 LAB — INFLUENZA PANEL BY PCR (TYPE A & B)
Influenza A By PCR: NEGATIVE
Influenza B By PCR: NEGATIVE

## 2018-09-07 LAB — APTT: aPTT: 49 seconds — ABNORMAL HIGH (ref 24–36)

## 2018-09-07 LAB — CREATININE, URINE, RANDOM: Creatinine, Urine: 131.42 mg/dL

## 2018-09-07 LAB — PROTIME-INR
INR: 3.09
INR: 3.41
Prothrombin Time: 31.4 seconds — ABNORMAL HIGH (ref 11.4–15.2)
Prothrombin Time: 33.9 seconds — ABNORMAL HIGH (ref 11.4–15.2)

## 2018-09-07 LAB — CK: CK TOTAL: 126 U/L (ref 49–397)

## 2018-09-07 LAB — POC OCCULT BLOOD, ED: Fecal Occult Bld: NEGATIVE

## 2018-09-07 LAB — ETHANOL: Alcohol, Ethyl (B): <10 mg/dL (ref <10)

## 2018-09-07 LAB — HIV ANTIBODY (ROUTINE TESTING W REFLEX): HIV Screen 4th Generation wRfx: NONREACTIVE

## 2018-09-07 MED ORDER — ROSUVASTATIN CALCIUM 5 MG PO TABS
10.0000 mg | ORAL_TABLET | Freq: Every day | ORAL | Status: DC
  Administered 2018-09-07 – 2018-09-08 (×2): 10 mg via ORAL
  Filled 2018-09-07 (×2): qty 2

## 2018-09-07 MED ORDER — SODIUM CHLORIDE 0.9 % IV BOLUS
500.0000 mL | Freq: Once | INTRAVENOUS | Status: AC
  Administered 2018-09-07: 500 mL via INTRAVENOUS

## 2018-09-07 MED ORDER — CARVEDILOL 3.125 MG PO TABS
3.1250 mg | ORAL_TABLET | Freq: Two times a day (BID) | ORAL | Status: DC
  Administered 2018-09-07 – 2018-09-09 (×5): 3.125 mg via ORAL
  Filled 2018-09-07 (×7): qty 1

## 2018-09-07 MED ORDER — SODIUM CHLORIDE 0.9 % IV SOLN
INTRAVENOUS | Status: DC
  Administered 2018-09-07 – 2018-09-08 (×4): via INTRAVENOUS

## 2018-09-07 MED ORDER — ONDANSETRON HCL 4 MG PO TABS: 4.0000 mg | ORAL_TABLET | Freq: Four times a day (QID) | ORAL | Status: DC | PRN

## 2018-09-07 MED ORDER — INSULIN ASPART 100 UNIT/ML ~~LOC~~ SOLN
0.0000 [IU] | Freq: Three times a day (TID) | SUBCUTANEOUS | Status: DC
  Administered 2018-09-08 (×2): 1 [IU] via SUBCUTANEOUS

## 2018-09-07 MED ORDER — GABAPENTIN 100 MG PO CAPS
200.0000 mg | ORAL_CAPSULE | Freq: Two times a day (BID) | ORAL | Status: DC
  Administered 2018-09-07 – 2018-09-09 (×4): 200 mg via ORAL
  Filled 2018-09-07 (×4): qty 2

## 2018-09-07 MED ORDER — ASPIRIN 81 MG PO CHEW
81.0000 mg | CHEWABLE_TABLET | Freq: Every day | ORAL | Status: DC
  Administered 2018-09-07 – 2018-09-09 (×3): 81 mg via ORAL
  Filled 2018-09-07 (×3): qty 1

## 2018-09-07 MED ORDER — ONDANSETRON HCL 4 MG/2ML IJ SOLN: 4.0000 mg | Freq: Four times a day (QID) | INTRAMUSCULAR | Status: DC | PRN

## 2018-09-07 MED ORDER — WARFARIN - PHARMACIST DOSING INPATIENT: Freq: Every day | Status: DC

## 2018-09-07 MED ORDER — ACETAMINOPHEN 650 MG RE SUPP: 650.0000 mg | Freq: Four times a day (QID) | RECTAL | Status: DC | PRN

## 2018-09-07 MED ORDER — ACETAMINOPHEN 325 MG PO TABS
650.0000 mg | ORAL_TABLET | Freq: Four times a day (QID) | ORAL | Status: DC | PRN
  Administered 2018-09-07 – 2018-09-08 (×4): 650 mg via ORAL
  Filled 2018-09-07 (×4): qty 2

## 2018-09-07 MED ORDER — TAMSULOSIN HCL 0.4 MG PO CAPS
0.4000 mg | ORAL_CAPSULE | Freq: Every day | ORAL | Status: DC
  Administered 2018-09-07 – 2018-09-08 (×2): 0.4 mg via ORAL
  Filled 2018-09-07 (×2): qty 1

## 2018-09-07 NOTE — ED Notes (Signed)
Breakfast tray has already been ordered for the pt.

## 2018-09-07 NOTE — ED Notes (Addendum)
20 ounces of fluid delivered with breakfast tray. Pt only drank 12 oz.

## 2018-09-07 NOTE — H&P (Signed)
History and Physical    Shane Murphy YDX:[id number] DOB: 10-14-1955 DOA: 09/06/2018  PCP: Terrial Rhodes, MD  Patient coming from: Home  I have personally briefly reviewed patient's old medical records in [place]  Chief Complaint: L sided weakness  HPI: Shane Murphy is a 63 y.o. male with medical history significant of severe aortic regurg and TAAA s/p AVR with bioprosthetic valve and TAAA repair in 10/2017.  Post op course complicated, he required [facility] ECMO for biventricular failure with PAF and V tach.  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  He remains on coumadin with INR goal 2-3 for h/o PAF (though no A.Fib since hospital stay then, cards is not sure wether stroke due to [facility] ECMO vs PAF), h/o R femoral DVT and IJ thrombus.  Patient presents to the ED this evening with c/o L sided weakness that is worse than baseline.  LKW 24h ago.  Daughter noted this AM had L facial droop that she hadnt appreciated in past.   ED Course: MRI brain neg for new acute stroke.  Neuro consult note in chart: they suspect this is a recrudescence of his previous symptoms in the setting of AKF.  Creat today 3.9 is up from 2.0 baseline last month.  No abd pain, no flank pain, no N/V/D, no cough, no fever/chills.   Review of Systems: As per HPI otherwise 10 point review of systems negative.   Past Medical History:  Diagnosis Date  . BPH (benign prostatic hyperplasia)   . Chronic diastolic CHF (congestive heart failure) ([place])   . CKD (chronic kidney disease)    Stage 3  . DVT (deep venous thrombosis) ([place])   . H/O aortic valve replacement    bioprosthetic  . History of thoracoabdominal aortic aneurysm (TAAA)    Repaired 10/2017, also AVR then  . Insulin dependent diabetes mellitus ([place])   . PAF (paroxysmal atrial fibrillation) ([place])   . Stroke [facility]) 72/0947   R MCA, embolic related to [facility] ECMO    Past Surgical History:  Procedure Laterality Date  . AORTIC VALVE  REPLACEMENT  10/2017   bioprosthetic (bovine)  . THORACOABDOMINAL AORTIC ANEURYSM REPAIR  10/2017     reports that he quit smoking about 2 years ago. His smoking use included cigarettes. He has never used smokeless tobacco. He reports previous drug use. Drug: "Crack" cocaine. He reports that he does not drink alcohol.  No Known Allergies  Family History  Problem Relation Age of Onset  . Hypertension Mother   . Stroke Brother      Prior to Admission medications   Medication Sig Start Date End Date Taking? Authorizing Provider  acetaminophen (TYLENOL) 500 MG tablet Take 500 mg by mouth every 6 (six) hours as needed (for pain or headaches).    Yes [provider]  aspirin 81 MG chewable tablet Chew 81 mg by mouth daily.   Yes [provider]  carvedilol (COREG) 3.125 MG tablet Take 3.125 mg by mouth 2 (two) times daily with a meal.   Yes [provider]  furosemide (LASIX) 20 MG tablet Take 20 mg by mouth daily.   Yes [provider]  gabapentin (NEURONTIN) 100 MG capsule Take 200 mg by mouth 2 (two) times daily.   Yes [provider]  insulin lispro (HUMALOG KWIKPEN) 100 UNIT/ML KwikPen Inject 6 Units into the skin 2 (two) times daily as needed (for a BGL of 150 or greater).  Yes [provider]  rosuvastatin (CRESTOR) 10 MG tablet Take 10 mg by mouth at bedtime.   Yes [provider]  tamsulosin (FLOMAX) 0.4 MG CAPS capsule Take 0.4 mg by mouth at bedtime.   Yes [provider]  warfarin (COUMADIN) 10 MG tablet Take 10 mg by mouth at bedtime.   Yes [provider]    Physical Exam: Vitals:   09/07/18 0015 09/07/18 0030 09/07/18 0045 09/07/18 0218  BP: (!) 111/52 (!) 110/56 (!) 118/56 (!) 114/58  Pulse: 78 77 78 78  Resp: 17 19 20    Temp:      TempSrc:      SpO2: 97% 98% 97% 97%  Weight:      Height:        Constitutional: NAD, calm, comfortable Eyes: PERRL, lids and conjunctivae normal ENMT:  Mucous membranes are moist. Posterior pharynx clear of any exudate or lesions.Normal dentition.  Neck: normal, supple, no masses, no thyromegaly Respiratory: clear to auscultation bilaterally, no wheezing, no crackles. Normal respiratory effort. No accessory muscle use.  Cardiovascular: Regular rate and rhythm, no murmurs / rubs / gallops. No extremity edema. 2+ pedal pulses. No carotid bruits.  Abdomen: no tenderness, no masses palpated. No hepatosplenomegaly. Bowel sounds positive.  Musculoskeletal: no clubbing / cyanosis. No joint deformity upper and lower extremities. Good ROM, no contractures. Normal muscle tone.  Skin: no rashes, lesions, ulcers. No induration Neurologic: 4/5 strength bilaterally, L facial weakness / droop. Psychiatric: Normal judgment and insight. Alert and oriented x 3. Normal mood.    Labs on Admission: I have personally reviewed following labs and imaging studies  CBC: Recent Labs  Lab 09/06/18 2336  WBC 14.2*  NEUTROABS 11.4*  HGB 10.9*  HCT 36.4*  MCV 84.5  PLT 010   Basic Metabolic Panel: Recent Labs  Lab 09/06/18 2336  NA 134*  K 4.5  CL 103  CO2 22  GLUCOSE 141*  BUN 42*  CREATININE 3.94*  CALCIUM 9.3   GFR: Estimated Creatinine Clearance: 24 mL/min (A) (by C-G formula based on SCr of 3.94 mg/dL (H)). Liver Function Tests: Recent Labs  Lab 09/06/18 2336  AST 97*  ALT 102*  ALKPHOS 83  BILITOT 0.8  PROT 7.1  ALBUMIN 3.6   No results for input(s): LIPASE, AMYLASE in the last 168 hours. No results for input(s): AMMONIA in the last 168 hours. Coagulation Profile: Recent Labs  Lab 09/06/18 2336  INR 3.09   Cardiac Enzymes: No results for input(s): CKTOTAL, CKMB, CKMBINDEX, TROPONINI in the last 168 hours. BNP (last 3 results) No results for input(s): PROBNP in the last 8760 hours. HbA1C: No results for input(s): HGBA1C in the last 72 hours. CBG: No results for input(s): GLUCAP in the last 168 hours. Lipid Profile: No  results for input(s): CHOL, HDL, LDLCALC, TRIG, CHOLHDL, LDLDIRECT in the last 72 hours. Thyroid Function Tests: No results for input(s): TSH, T4TOTAL, FREET4, T3FREE, THYROIDAB in the last 72 hours. Anemia Panel: No results for input(s): VITAMINB12, FOLATE, FERRITIN, TIBC, IRON, RETICCTPCT in the last 72 hours. Urine analysis:    Component Value Date/Time   COLORURINE YELLOW 09/07/2018 [place] 09/07/2018 0237   LABSPEC 1.010 09/07/2018 0237   PHURINE 5.0 09/07/2018 [place] 09/07/2018 0237   HGBUR NEGATIVE 09/07/2018 0237   [employer] NEGATIVE 09/07/2018 [place] 09/07/2018 0237   PROTEINUR NEGATIVE 09/07/2018 0237   NITRITE NEGATIVE 09/07/2018 0237   LEUKOCYTESUR NEGATIVE 09/07/2018 0237  Radiological Exams on Admission: Dg Chest 2 View  Result Date: 09/06/2018 CLINICAL DATA:  Weakness after getting flu shot. EXAM: CHEST - 2 VIEW COMPARISON:  None. FINDINGS: Postoperative changes in the mediastinum. Surgical clips in the right upper chest. Postoperative cerclage wire with old fracture deformity of the distal left clavicle. Heart size and pulmonary vascularity are normal. Lungs appear clear and expanded. No airspace disease or consolidation. No blunting of costophrenic angles. No pneumothorax. Mediastinal contours appear intact. Degenerative changes in the spine. IMPRESSION: No evidence of active pulmonary disease. Electronically Signed   By: Lucienne Capers M.D.   On: 09/06/2018 23:53   Ct Head Wo Contrast  Result Date: 09/07/2018 CLINICAL DATA:  Worsening weakness of the left side EXAM: CT HEAD WITHOUT CONTRAST TECHNIQUE: Contiguous axial images were obtained from the base of the skull through the vertex without intravenous contrast. COMPARISON:  08/07/2018 FINDINGS: Brain: No evidence of acute infarction, hemorrhage, hydrocephalus, extra-axial collection or mass lesion/mass effect. Old right MCA territory infarct with  encephalomalacia. Old small left occipital lobe infarct. Vascular: No hyperdense vessel or unexpected calcification. Skull: No osseous abnormality. Sinuses/Orbits: Visualized paranasal sinuses are clear. Visualized mastoid sinuses are clear. Visualized orbits demonstrate no focal abnormality. Other: None IMPRESSION: No acute intracranial pathology. Electronically Signed   By: Kathreen Devoid   On: 09/07/2018 00:13   Mr Brain Wo Contrast  Result Date: 09/07/2018 CLINICAL DATA:  Initial evaluation for increased left-sided weakness. EXAM: MRI HEAD WITHOUT CONTRAST TECHNIQUE: Multiplanar, multiecho pulse sequences of the brain and surrounding structures were obtained without intravenous contrast. COMPARISON:  Prior CT from 09/06/2018 FINDINGS: Brain: Generalized age-related cerebral atrophy. Patchy T2/FLAIR hyperintensity within the periventricular deep white matter both cerebral hemispheres, most like related chronic microvascular disease, mild in nature. Extensive encephalomalacia with gliosis seen involving the right frontal and parietal lobes as well as the right temporal occipital region, compatible with remote right MCA-MCA/PCA watershed territory infarcts. Additional smaller cortical infarct noted at the left occipital lobe, compatible with remote left PCA territory infarct. Scattered chronic hemosiderin staining within these regions compatible with chronic blood products and/or laminar necrosis. Associated wallerian degeneration at the right cerebral peduncle extending into the brainstem. No abnormal foci of restricted diffusion to suggest acute or subacute ischemia. Gray-white matter differentiation otherwise maintained. No evidence for acute intracranial hemorrhage. No mass lesion, midline shift or mass effect. No hydrocephalus. No extra-axial fluid collection. Pituitary gland within normal limits. Vascular: Major intracranial vascular flow voids maintained. Skull and upper cervical spine: Craniocervical  junction normal. Bone marrow signal intensity within normal limits. No scalp soft tissue abnormality. Sinuses/Orbits: Globes and orbital soft tissues within normal limits. Paranasal sinuses are largely clear. Trace opacity bilateral mastoid air cells, of doubtful significance. Inner ear structures grossly normal. Other: None. IMPRESSION: 1. No acute intracranial abnormality. 2. 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 3. Underlying age-related cerebral atrophy with mild chronic small vessel ischemic disease. Electronically Signed   By: Jeannine Boga M.D.   On: 09/07/2018 01:54    EKG: Independently reviewed.  Assessment/Plan Principal Problem:   Acute on chronic kidney failure (HCC) Active Problems:   Left-sided weakness   PAF (paroxysmal atrial fibrillation) (HCC)   HTN (hypertension)    1. AKI on CKD stage 3 - 1. Unclear as to cause 2. IVF: NS at 100 cc/hr 3. US renal 4. UA pending 5. Urine lytes 6. Repeat BMP in AM 7. Holding lasix 8. Likely warrants nephrology consult in AM 2. L sided weakness -  acute on chronic 1. No acute stroke on MRI 2. Neurology believes this represents recrudescence of prior stroke symptoms in setting of stressor (AKI). 3. PAF - 1. Continue coumadin 4. HTN - 1. Continue coreg and flomax 5. DM2 - 1. Sensitive SSI AC  DVT prophylaxis: Coumadin Code Status: Full Family Communication: Family at bedside Disposition Plan: Home after admit Consults called: Neuro consult note in chart Admission status: Admit to inpatient  Severity of Illness: The appropriate patient status for this patient is INPATIENT. Inpatient status is judged to be reasonable and necessary in order to provide the required intensity of service to ensure the patient's safety. The patient's presenting symptoms, physical exam findings, and initial radiographic and laboratory data in the context of their chronic comorbidities is felt to place them at high risk for  further clinical deterioration. Furthermore, it is not anticipated that the patient will be medically stable for discharge from the hospital within 2 midnights of admission. The following factors support the patient status of inpatient.   " The patient's presenting symptoms include L sided weakness. " The worrisome physical exam findings include L sided facial droop. " The initial radiographic and laboratory data are worrisome because of AKF. " The chronic co-morbidities include prior stroke, PAF, HTN, DM2.   * I certify that at the point of admission it is my clinical judgment that the patient will require inpatient hospital care spanning beyond 2 midnights from the point of admission due to high intensity of service, high risk for further deterioration and high frequency of surveillance required.*    Mikeal Winstanley M. DO [facility]  How to contact the [facility] Attending or Consulting provider [place] or covering provider during after hours [place], for this patient?  1. Check the care team in [facility] and look for a) attending/consulting [facility] provider listed and b) the [facility] team listed 2. Log into [email or web address]  Amion Physician Scheduling and messaging for groups and whole hospitals  On call and physician scheduling software for group practices, residents, hospitalists and other medical providers for call, clinic, rotation and shift schedules. OnCall [employer] is a hospital-wide system for scheduling doctors and paging doctors on call. EasyPlot is for scientific plotting and data analysis.  [email or web address]  and use [place]'s universal password to access. If you do not have the password, please contact the hospital operator.  3. Locate the [facility] provider you are looking for under [facility] and page to a number that you can be directly reached. 4. If you still have difficulty reaching the provider, please page the [facility] (Director on Call) for the Hospitalists listed on amion for  assistance.  09/07/2018, 3:23 AM

## 2018-09-07 NOTE — ED Notes (Signed)
Pt transported to US

## 2018-09-07 NOTE — Progress Notes (Addendum)
PROGRESS NOTE  Shane Murphy ZOX:[id number] DOB: April 03, 1956 DOA: 09/06/2018 PCP: Terrial Rhodes, MD  HPI/Recap of past 24 hours:  C/o headache, improved with tylenol Reports chronic left sided pain due to past stroke Daughter at bedside  Assessment/Plan: Principal Problem:   Acute on chronic kidney failure (HCC) Active Problems:   Left-sided weakness   PAF (paroxysmal atrial fibrillation) (HCC)   HTN (hypertension)  AKI on CKD -urine bland, renal US no hydro -he reports poor appetite since Monday, but he continue to take his daily lasix, suspect AKI is due to dehydration -will add on ck and spep/upep -continue hydration, cr appear improving, renal dosing meds  Left sided weakness/left sided pain/poor vision left eye (presenting symptom) -acute on chronic -h/o CVA with left hemiparesis, poor vision in left eye --code stroke was activated in the ED, neurology consulted, MRI no acute findings, Neurology believes this represents recrudescence of prior stroke symptoms in setting of stressor (AKI). -he is followed by neurology in [place], I have reviewed note from his neurologist under careeverywhere, he is on asa and coumadin, statin,  -will get PT eval  Insulin dependent DM2 -will check a1c, order ssi, carb modified diet, dietitian consult  history of a bicuspid aortic valve with severe aortic insufficiency and ascending aortic aneurysm underwent elective aortic root and arch repair with a graft and freestyle bioprosthetic AVR with coronary reimplantation on 11/18/2017, post op required VA ECMO for biventricular failure  developed post op afib and cva and a right common femoral DVT and a DVT of the right internal jugular vein s/p ivc filter  PAF on coumadin  Presented with supratherapeutic INR, coumadin held  Chronic diastolic chf, on daily lasix, presented with aki dehydration, lasix held, on ivf Plan to discharge home on lasix mwf instead of daily  BPH: on  flomax   Elevated lft, unclear etiology, denies n/v, denies ab pain Will check ck, add on hepatitis panel, repeat lft in am  Patient wonder if he has a reaction to pneumonia shot, he reports has he first pneumonia shot on Monday, then developed weakness on Tuesday and Wednesday, will defer to pcp to follow up.   Code Status: full  Family Communication: patient and daughter at bedside  Disposition Plan: home in 1-2 days, pending cr improvement   Consultants:  neurology  Procedures:  none  Antibiotics:  none   Objective: BP (!) 145/80 (BP Location: Left Arm)   Pulse 74   Temp 98.3 F (36.8 C) (Oral)   Resp 16   Ht 6' (1.829 m)   Wt 94.3 kg   SpO2 100%   BMI 28.20 kg/m   Intake/Output Summary (Last 24 hours) at 09/07/2018 1736 Last data filed at 09/07/2018 1442 Gross per 24 hour  Intake 1184 ml  Output 550 ml  Net 634 ml   Filed Weights   09/06/18 2324 09/07/18 1456  Weight: 96.2 kg 94.3 kg    Exam: Patient is examined daily including today on 09/07/2018, exams remain the same as of yesterday except that has changed    General:  NAD  Cardiovascular: RRR  Respiratory: CTABL  Abdomen: Soft/ND/NT, positive BS  Musculoskeletal: No Edema,  Neuro: alert, oriented , left sided weakness, left eye poor vision chronic   Data Reviewed: Basic Metabolic Panel: Recent Labs  Lab 09/06/18 2336 09/07/18 0405  NA 134* 136  K 4.5 4.2  CL 103 103  CO2 22 20*  GLUCOSE 141* 129*  BUN 42* 40*  CREATININE 3.94*  3.67*  CALCIUM 9.3 9.1   Liver Function Tests: Recent Labs  Lab 09/06/18 2336  AST 97*  ALT 102*  ALKPHOS 83  BILITOT 0.8  PROT 7.1  ALBUMIN 3.6   No results for input(s): LIPASE, AMYLASE in the last 168 hours. No results for input(s): AMMONIA in the last 168 hours. CBC: Recent Labs  Lab 09/06/18 2336 09/07/18 0405  WBC 14.2* 13.8*  NEUTROABS 11.4*  --   HGB 10.9* 10.4*  HCT 36.4* 34.1*  MCV 84.5 85.5  PLT 184 167   Cardiac Enzymes:    Recent Labs  Lab 09/07/18 0831  CKTOTAL 126   BNP (last 3 results) No results for input(s): BNP in the last 8760 hours.  ProBNP (last 3 results) No results for input(s): PROBNP in the last 8760 hours.  CBG: No results for input(s): GLUCAP in the last 168 hours.  No results found for this or any previous visit (from the past 240 hour(s)).   Studies: Dg Chest 2 View  Result Date: 09/06/2018 CLINICAL DATA:  Weakness after getting flu shot. EXAM: CHEST - 2 VIEW COMPARISON:  None. FINDINGS: Postoperative changes in the mediastinum. Surgical clips in the right upper chest. Postoperative cerclage wire with old fracture deformity of the distal left clavicle. Heart size and pulmonary vascularity are normal. Lungs appear clear and expanded. No airspace disease or consolidation. No blunting of costophrenic angles. No pneumothorax. Mediastinal contours appear intact. Degenerative changes in the spine. IMPRESSION: No evidence of active pulmonary disease. Electronically Signed   By: Lucienne Capers M.D.   On: 09/06/2018 23:53   Ct Head Wo Contrast  Result Date: 09/07/2018 CLINICAL DATA:  Worsening weakness of the left side EXAM: CT HEAD WITHOUT CONTRAST TECHNIQUE: Contiguous axial images were obtained from the base of the skull through the vertex without intravenous contrast. COMPARISON:  08/07/2018 FINDINGS: Brain: No evidence of acute infarction, hemorrhage, hydrocephalus, extra-axial collection or mass lesion/mass effect. Old right MCA territory infarct with encephalomalacia. Old small left occipital lobe infarct. Vascular: No hyperdense vessel or unexpected calcification. Skull: No osseous abnormality. Sinuses/Orbits: Visualized paranasal sinuses are clear. Visualized mastoid sinuses are clear. Visualized orbits demonstrate no focal abnormality. Other: None IMPRESSION: No acute intracranial pathology. Electronically Signed   By: Kathreen Devoid   On: 09/07/2018 00:13   Mr Brain Wo Contrast  Result  Date: 09/07/2018 CLINICAL DATA:  Initial evaluation for increased left-sided weakness. EXAM: MRI HEAD WITHOUT CONTRAST TECHNIQUE: Multiplanar, multiecho pulse sequences of the brain and surrounding structures were obtained without intravenous contrast. COMPARISON:  Prior CT from 09/06/2018 FINDINGS: Brain: Generalized age-related cerebral atrophy. Patchy T2/FLAIR hyperintensity within the periventricular deep white matter both cerebral hemispheres, most like related chronic microvascular disease, mild in nature. Extensive encephalomalacia with gliosis seen involving the right frontal and parietal lobes as well as the right temporal occipital region, compatible with remote right MCA-MCA/PCA watershed territory infarcts. Additional smaller cortical infarct noted at the left occipital lobe, compatible with remote left PCA territory infarct. Scattered chronic hemosiderin staining within these regions compatible with chronic blood products and/or laminar necrosis. Associated wallerian degeneration at the right cerebral peduncle extending into the brainstem. No abnormal foci of restricted diffusion to suggest acute or subacute ischemia. Gray-white matter differentiation otherwise maintained. No evidence for acute intracranial hemorrhage. No mass lesion, midline shift or mass effect. No hydrocephalus. No extra-axial fluid collection. Pituitary gland within normal limits. Vascular: Major intracranial vascular flow voids maintained. Skull and upper cervical spine: Craniocervical junction normal. Bone marrow signal intensity  within normal limits. No scalp soft tissue abnormality. Sinuses/Orbits: Globes and orbital soft tissues within normal limits. Paranasal sinuses are largely clear. Trace opacity bilateral mastoid air cells, of doubtful significance. Inner ear structures grossly normal. Other: None. IMPRESSION: 1. No acute intracranial abnormality. 2. 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 3. Underlying  age-related cerebral atrophy with mild chronic small vessel ischemic disease. Electronically Signed   By: Jeannine Boga M.D.   On: 09/07/2018 01:54   US Renal  Result Date: 09/07/2018 CLINICAL DATA:  Acute renal failure EXAM: RENAL / URINARY TRACT ULTRASOUND COMPLETE COMPARISON:  None. FINDINGS: Right Kidney: Renal measurements: 12.3 x 4.1 x 5.4 cm = volume: 146 mL . Echogenicity within normal limits. No mass or hydronephrosis visualized. Left Kidney: Renal measurements: 11.3 x 6 x 5.9 cm = volume: 212 mL. Echogenicity within normal limits. No mass or hydronephrosis visualized. Bladder: No bladder wall thickening or filling defects. Enlarged and lobular appearance of the prostate gland. Prostate measures 5.6 x 5 x 3.9 cm. Prostatic impression on the bladder base. IMPRESSION: Normal ultrasound appearance of the kidneys. No hydronephrosis. Normal appearance of the bladder. Enlarged prostate gland. Electronically Signed   By: Lucienne Capers M.D.   On: 09/07/2018 03:48    Scheduled Meds: . carvedilol  3.125 mg Oral BID WC  . gabapentin  200 mg Oral BID  . rosuvastatin  10 mg Oral QHS  . tamsulosin  0.4 mg Oral QHS    Continuous Infusions: . sodium chloride 100 mL/hr at 09/07/18 0600     Time spent: 35 mins I have personally reviewed and interpreted on  09/07/2018 daily labs,  imagings as discussed above under date review session and assessment and plans.  I reviewed all nursing notes, pharmacy notes,  vitals, pertinent old records under care everywhere   I have discussed plan of care as described above with RN , patient and family on 09/07/2018   Florencia Reasons MD, PhD  [facility] Pager [phone]. If 7PM-7AM, please contact night-coverage at [email or web address], password [facility] 09/07/2018, 5:36 PM  LOS: 0 days

## 2018-09-07 NOTE — ED Notes (Signed)
Message sent to pharmacy requesting coreg.

## 2018-09-07 NOTE — ED Notes (Signed)
RN called for lunch tray for the pt.

## 2018-09-07 NOTE — Progress Notes (Addendum)
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   Medical History: Past Medical History:  Diagnosis Date  . BPH (benign prostatic hyperplasia)   . CKD (chronic kidney disease)   . Diabetes mellitus without complication ([place])   . Insulin dependent diabetes mellitus ([place])   . S/P TAVR (transcatheter aortic valve replacement)   . Stroke [facility])     Medications:  See electronic med rec  Assessment: 63 y.o. M presents with L-sided weakness. CT shows no acute intracranial process. Pt on coumadin PTA for PAF, lower extremity DVT and jugular vein thrombosis. Admission INR 3.09 (slightly supratherapeutic). CBC ok at baseline. Home coumadin 10mg  daily with last dose taken 2/5 2030  Goal of Therapy:  INR 2-3 Monitor platelets by anticoagulation protocol: Yes   Plan:  Daily INR  Sherlon Handing, PharmD, BCPS Clinical pharmacist  **Pharmacist phone directory can now be found on [email or web address] ([email or web address]).  Listed under [place]. 09/07/2018,2:48 AM   15:30 pm: INR still supra - therapeutic this AM,  Hold Warfarin.  AM INR Thank you Anette Guarneri, PharmD

## 2018-09-07 NOTE — ED Notes (Signed)
Breakfast tray ordered 

## 2018-09-07 NOTE — ED Notes (Signed)
Pt transported to MRI 

## 2018-09-07 NOTE — Consult Note (Signed)
Neurology Consultation Reason for Consult: Left-sided weakness Referring Physician: Patient  CC: Left-sided weakness  History is obtained from: Patient  HPI: Shane Murphy is a 63 y.o. male he was in his normal state of health until Monday at which point he got a pneumonia shot.  He states that since that time things have just not been quite right.  He has noticed that his left side has become weaker and he has bilateral eye pain, and is been feeling slightly feverish.  Of note he has a history of a previous stroke which occurred perioperatively.  LKW: Monday tpa given?: no, outside of window    ROS: A 14 point ROS was performed and is negative except as noted in the HPI.  Past Medical History:  Diagnosis Date  . BPH (benign prostatic hyperplasia)   . CKD (chronic kidney disease)   . Diabetes mellitus without complication ([place])   . Insulin dependent diabetes mellitus ([place])   . S/P TAVR (transcatheter aortic valve replacement)   . Stroke [facility])      History reviewed. No pertinent family history.   Social History:  reports that he quit smoking about 2 years ago. His smoking use included cigarettes. He has never used smokeless tobacco. He reports that he does not drink alcohol or use drugs.   Exam: Current vital signs: BP (!) 110/56   Pulse 77   Temp 99.3 F (37.4 C) (Oral)   Resp 19   Ht 6' (1.829 m)   Wt 96.2 kg   SpO2 98%   BMI 28.75 kg/m  Vital signs in last 24 hours: Temp:  [99.3 F (37.4 C)] 99.3 F (37.4 C) (02/05 2322) Pulse Rate:  [77-85] 77 (02/06 0030) Resp:  [16-21] 19 (02/06 0030) BP: (110-116)/(52-59) 110/56 (02/06 0030) SpO2:  [97 %-98 %] 98 % (02/06 0030) Weight:  [96.2 kg] 96.2 kg (02/05 2324)   Physical Exam  Constitutional: Appears well-developed and well-nourished.  Psych: Affect appropriate to situation Eyes: No scleral injection HENT: No OP obstrucion Head: Normocephalic.  Cardiovascular: Normal rate and regular rhythm.   Respiratory: Effort normal, non-labored breathing GI: Soft.  No distension. There is no tenderness.  Skin: WDI  Neuro: Mental Status: Patient is awake, alert, oriented to person, place, month, year, and situation. Patient is able to give a clear and coherent history. No signs of aphasia or neglect Cranial Nerves: II: Though he is slower to count objects in the left visual field, he does count in both hemifields.  Pupils are equal, round, and reactive to light. III,IV, VI: EOMI without ptosis or diploplia.  V: Facial sensation is symmetric to temperature VII: Facial movement with left facial weakness VIII: hearing is intact to voice X: Uvula elevates symmetrically XI: Shoulder shrug is symmetric. XII: tongue is midline without atrophy or fasciculations.  Motor: Tone is normal. Bulk is normal. 4/5 strength bilaterally. Sensory: Sensation is symmetric to light touch and temperature in the arms and legs. Cerebellar: FNF  intact bilaterally  I have reviewed labs in epic and the results pertinent to this consultation are: CMP-acute elevation of creatinine from 1.9-3.9 CBC-WBC 14  I have reviewed the images obtained: CT head-no acute changes  Impression: 62 year old male with worsening left-sided weakness in the setting of elevated white count, general malaise, elevated creatinine.  I suspect this is a recrudescence of his previous symptoms in the setting of physiological stressors.  An MRI of the brain would be reasonable, but if this is negative then I would not  pursue any further neurological work-up.  Recommendations: 1) MRI brain 2) if this is negative and no further neurological work-up is needed.   Roland Rack, MD [facility] [phone]  If 7pm- 7am, please page neurology on call as listed in [place].

## 2018-09-07 NOTE — Progress Notes (Signed)
REceived report from [facility] in ED

## 2018-09-07 NOTE — ED Notes (Signed)
Pt ambulated to and from restroom without difficulty.   

## 2018-09-07 NOTE — ED Notes (Signed)
Pt returned from MRI °

## 2018-09-08 LAB — COMPREHENSIVE METABOLIC PANEL
ALBUMIN: 3 g/dL — AB (ref 3.5–5.0)
ALT: 91 U/L — ABNORMAL HIGH (ref 0–44)
AST: 52 U/L — ABNORMAL HIGH (ref 15–41)
Alkaline Phosphatase: 85 U/L (ref 38–126)
Anion gap: 13 (ref 5–15)
BUN: 34 mg/dL — ABNORMAL HIGH (ref 6–20)
CO2: 19 mmol/L — ABNORMAL LOW (ref 22–32)
Calcium: 9 mg/dL (ref 8.9–10.3)
Chloride: 105 mmol/L (ref 98–111)
Creatinine, Ser: 3.15 mg/dL — ABNORMAL HIGH (ref 0.61–1.24)
GFR calc Af Amer: 24 mL/min — ABNORMAL LOW (ref >60)
GFR calc non Af Amer: 20 mL/min — ABNORMAL LOW (ref >60)
Glucose, Bld: 129 mg/dL — ABNORMAL HIGH (ref 70–99)
Potassium: 4.3 mmol/L (ref 3.5–5.1)
Sodium: 137 mmol/L (ref 135–145)
Total Bilirubin: 0.8 mg/dL (ref 0.3–1.2)
Total Protein: 6.4 g/dL — ABNORMAL LOW (ref 6.5–8.1)

## 2018-09-08 LAB — CBC WITH DIFFERENTIAL/PLATELET
ABS IMMATURE GRANULOCYTES: 0.07 10*3/uL (ref 0.00–0.07)
BASOS ABS: 0 10*3/uL (ref 0.0–0.1)
Basophils Relative: 0 %
Eosinophils Absolute: 0.2 10*3/uL (ref 0.0–0.5)
Eosinophils Relative: 1 %
HCT: 34.6 % — ABNORMAL LOW (ref 39.0–52.0)
Hemoglobin: 10.4 g/dL — ABNORMAL LOW (ref 13.0–17.0)
IMMATURE GRANULOCYTES: 1 %
Lymphocytes Relative: 11 %
Lymphs Abs: 1.4 10*3/uL (ref 0.7–4.0)
MCH: 25 pg — ABNORMAL LOW (ref 26.0–34.0)
MCHC: 30.1 g/dL (ref 30.0–36.0)
MCV: 83.2 fL (ref 80.0–100.0)
Monocytes Absolute: 1.5 10*3/uL — ABNORMAL HIGH (ref 0.1–1.0)
Monocytes Relative: 11 %
NEUTROS PCT: 76 %
NRBC: 0 % (ref 0.0–0.2)
Neutro Abs: 10.1 10*3/uL — ABNORMAL HIGH (ref 1.7–7.7)
Platelets: 173 10*3/uL (ref 150–400)
RBC: 4.16 MIL/uL — ABNORMAL LOW (ref 4.22–5.81)
RDW: 13.4 % (ref 11.5–15.5)
WBC: 13.2 10*3/uL — ABNORMAL HIGH (ref 4.0–10.5)

## 2018-09-08 LAB — GLUCOSE, CAPILLARY
Glucose-Capillary: 126 mg/dL — ABNORMAL HIGH (ref 70–99)
Glucose-Capillary: 115 mg/dL — ABNORMAL HIGH (ref 70–99)
Glucose-Capillary: 125 mg/dL — ABNORMAL HIGH (ref 70–99)
Glucose-Capillary: 104 mg/dL — ABNORMAL HIGH (ref 70–99)

## 2018-09-08 LAB — PROTIME-INR
INR: 3.05
Prothrombin Time: 31.1 seconds — ABNORMAL HIGH (ref 11.4–15.2)

## 2018-09-08 LAB — KAPPA/LAMBDA LIGHT CHAINS
Kappa free light chain: 55.1 mg/L — ABNORMAL HIGH (ref 3.3–19.4)
Kappa, lambda light chain ratio: 1.51 (ref 0.26–1.65)
Lambda free light chains: 36.4 mg/L — ABNORMAL HIGH (ref 5.7–26.3)

## 2018-09-08 LAB — HEMOGLOBIN A1C
Hgb A1c MFr Bld: 12.4 % — ABNORMAL HIGH (ref 4.8–5.6)
Mean Plasma Glucose: 309.18 mg/dL

## 2018-09-08 MED ORDER — WARFARIN SODIUM 2.5 MG PO TABS
2.5000 mg | ORAL_TABLET | Freq: Once | ORAL | Status: AC
  Administered 2018-09-08: 2.5 mg via ORAL
  Filled 2018-09-08: qty 1

## 2018-09-08 NOTE — Plan of Care (Signed)
  Problem: Food- and Nutrition-Related Knowledge Deficit (NB-1.1) Goal: Nutrition education Description Formal process to instruct or train a patient/client in a skill or to impart knowledge to help patients/clients voluntarily manage or modify food choices and eating behavior to maintain or improve health. Outcome: Completed/Met   

## 2018-09-08 NOTE — Progress Notes (Signed)
Delivered 24 hour urine collection to the lab.

## 2018-09-08 NOTE — Progress Notes (Signed)
[facility] for Coumadin Indication: PAF, lower extremity DVT and jugular vein thrombosis  No Known Allergies  Patient Measurements: Height: 6' (182.9 cm) Weight: 207 lb 14.3 oz (94.3 kg) IBW/kg (Calculated) : 77.6  Vital Signs: Temp: 99.1 F (37.3 C) (02/07 0642) Temp Source: Oral (02/07 0642) BP: 130/68 (02/07 0642) Pulse Rate: 75 (02/07 0642)  Labs: Recent Labs    09/06/18 2336 09/07/18 0405 09/07/18 0831 09/08/18 0611  HGB 10.9* 10.4*  --  10.4*  HCT 36.4* 34.1*  --  34.6*  PLT 184 167  --  173  APTT 49*  --   --   --   LABPROT 31.4* 33.9*  --  31.1*  INR 3.09 3.41  --  3.05  CREATININE 3.94* 3.67*  --  3.15*  CKTOTAL  --   --  126  --     Estimated Creatinine Clearance: 29.7 mL/min (A) (by C-G formula based on SCr of 3.15 mg/dL (H)).  Assessment: 63 y.o. M presents with L-sided weakness. CT shows no acute intracranial process. Pt on coumadin PTA for PAF, lower extremity DVT and jugular vein thrombosis. Admission INR 3.09 (slightly supratherapeutic). CBC ok at baseline.  INR today = 3.05 (warfarin held since [facility] coumadin 10mg  daily with last dose taken 2/5 2030  Goal of Therapy:  INR 2-3 Monitor platelets by anticoagulation protocol: Yes   Plan:  Warfarin 2.5 mg po x 1 (to prevent drastic drop) Daily INR  Thank you Anette Guarneri, PharmD [phone]  **Pharmacist phone directory can now be found on [email or web address] ([email or web address]).  Listed under [place]. 09/08/2018,9:49 AM

## 2018-09-08 NOTE — Evaluation (Signed)
Physical Therapy Evaluation & Discharge Patient Details Name: Shane Murphy MRN: [id number] DOB: 11-Apr-1956 Today's Date: 09/08/2018   History of Present Illness  Pt is a 63 y/o male admitted secondary to worsening L sided weakness. MRI was negative for any acute findings. Symptoms attributed to AKI. PMH including but not limited to CHF, CKD, DM, HTN and prior CVA.    Clinical Impression  Pt presented supine in bed with HOB elevated, awake and willing to participate in therapy session. Prior to admission, pt reported that he was independent with all functional mobility and ADLs. Pt lives with his daughter in a single level home with a level entry. He is currently at supervision to independent level with all functional mobility including ambulation and higher level balance tasks. Pt reported that he feels he is back to his baseline in regards to functional mobility. No further acute PT needs identified at this time. PT signing off.     Follow Up Recommendations No PT follow up    Equipment Recommendations  None recommended by PT    Recommendations for Other Services       Precautions / Restrictions Precautions Precautions: None Restrictions Weight Bearing Restrictions: No      Mobility  Bed Mobility Overal bed mobility: Independent                Transfers Overall transfer level: Independent                  Ambulation/Gait Ambulation/Gait assistance: Supervision Gait Distance (Feet): 200 Feet Assistive device: None Gait Pattern/deviations: Step-through pattern;Decreased stride length;Drifts right/left Gait velocity: able to fluctuate   General Gait Details: mild instability but no overt LOB or need for physical assistance, supervision for safety  Stairs            Wheelchair Mobility    Modified Rankin (Stroke Patients Only)       Balance Overall balance assessment: Needs assistance Sitting-balance support: Feet supported Sitting  balance-Leahy Scale: Normal     Standing balance support: No upper extremity supported Standing balance-Leahy Scale: Good               High level balance activites: Direction changes;Turns;Sudden stops;Head turns High Level Balance Comments: no LOB or need for physical assistance, supervision for safety             Pertinent Vitals/Pain Pain Assessment: No/denies pain    Home Living Family/patient expects to be discharged to:: Private residence Living Arrangements: Children Available Help at Discharge: Family Type of Home: House Home Access: Level entry     Home Layout: One level Home Equipment: None      Prior Function Level of Independence: Independent         Comments: exercises every day     Hand Dominance        Extremity/Trunk Assessment   Upper Extremity Assessment Upper Extremity Assessment: LUE deficits/detail LUE Deficits / Details: weakness from prior CVA; pt additionally with fractured wrist approximately 1 year ago (wears a support brace, has never had surgery)    Lower Extremity Assessment Lower Extremity Assessment: Overall WFL for tasks assessed    Cervical / Trunk Assessment Cervical / Trunk Assessment: Normal  Communication   Communication: No difficulties  Cognition Arousal/Alertness: Awake/alert Behavior During Therapy: WFL for tasks assessed/performed Overall Cognitive Status: Within Functional Limits for tasks assessed  General Comments      Exercises     Assessment/Plan    PT Assessment Patent does not need any further PT services  PT Problem List         PT Treatment Interventions      PT Goals (Current goals can be found in the Care Plan section)  Acute Rehab PT Goals Patient Stated Goal: "to live on my own" PT Goal Formulation: All assessment and education complete, DC therapy    Frequency     Barriers to discharge        Co-evaluation                AM-PAC PT "6 Clicks" Mobility  Outcome Measure Help needed turning from your back to your side while in a flat bed without using bedrails?: None Help needed moving from lying on your back to sitting on the side of a flat bed without using bedrails?: None Help needed moving to and from a bed to a chair (including a wheelchair)?: None Help needed standing up from a chair using your arms (e.g., wheelchair or bedside chair)?: None Help needed to walk in hospital room?: None Help needed climbing 3-5 steps with a railing? : None 6 Click Score: 24    End of Session   Activity Tolerance: Patient tolerated treatment well Patient left: in bed;with call bell/phone within reach;with family/visitor present Nurse Communication: Mobility status PT Visit Diagnosis: Other abnormalities of gait and mobility (R26.89)    Time: 2820-8138 PT Time Calculation (min) (ACUTE ONLY): 16 min   Charges:   PT Evaluation $PT Eval Low Complexity: [place], PT, DPT  Acute Rehabilitation Services Pager [phone] Office [place] 09/08/2018, 4:36 PM

## 2018-09-08 NOTE — Progress Notes (Signed)
Inpatient Diabetes Program Recommendations  AACE/ADA: New Consensus Statement on Inpatient Glycemic Control (2015)  Target Ranges:  Prepandial:   less than 140 mg/dL      Peak postprandial:   less than 180 mg/dL (1-2 hours)      Critically ill patients:  140 - 180 mg/dL   Spoke with patient and daughter about diabetes and home regimen for diabetes control. Patient reports that he has a PCP in [place] for diabetes management and currently takes Humalog 6 units BID for glucose >150.  Patient reports experiencing itching on his chest and abdomen area after his thoracic surgery last year. Patient also reports that he experiences pain when injecting insulin. Patient was diagnosed with DM right before his surgery last year.  Patient and daughter report high glucose levels and have just had an ED visit in January for a glucose >600. Glucose trends have been in the 100 range this admission in addition to the patient chart in [place].  Discussed medication options for out patient. The only medication to consider with renal function is Tradjenta. I highly encouraged patient and daughter for patient to see an Endocrinologist. It is imperative for glucose control in addition to avoiding hypoglycemia with the patients medical history and complications.   Discussed glucose and A1C goals. Discussed importance of checking CBGs and maintaining good CBG control to prevent long-term and short-term complications. Encouraged patient to check glucose at least 2 times a day.  Patient verbalized understanding of information discussed and he states that he has no further questions at this time related to diabetes.  Thanks,  Tama Headings RN, MSN, BC-ADM Inpatient Diabetes Coordinator Team Pager [phone] (8a-5p)

## 2018-09-08 NOTE — Progress Notes (Signed)
PROGRESS NOTE  Shane Murphy IFO:[id number] DOB: 08/15/1955 DOA: 09/06/2018 PCP: Terrial Rhodes, MD  HPI/Recap of past 24 hours:  3liters urine last 24hrs, Reports feeling better,  Brother at bedside  Assessment/Plan: Principal Problem:   Acute on chronic kidney failure ([place]) Active Problems:   Left hemiparesis (HCC)   PAF (paroxysmal atrial fibrillation) (HCC)   HTN (hypertension)   Supratherapeutic INR   Insulin dependent diabetes mellitus (HCC)   FTT (failure to thrive) in adult  AKI on CKD III -urine bland, renal US no hydro -he reports poor appetite since Monday, but he continue to take his daily lasix, suspect AKI is due to dehydration -ck unremarkable and spep/upep in process -continue hydration, cr appear improving, renal dosing meds  Left sided weakness/left sided pain/poor vision left eye (presenting symptom) -acute on chronic -h/o CVA with left hemiparesis, poor vision in left eye --code stroke was activated in the ED, neurology consulted, MRI no acute findings, Neurology believes this represents recrudescence of prior stroke symptoms in setting of stressor (AKI). 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 underwent elective aortic root and arch repair with a graft and freestyle bioprosthetic AVR with coronary reimplantation on 11/18/2017, post op required VA ECMO for biventricular failure  developed post op afib and cva and a right common femoral DVT and a DVT of the right internal jugular vein s/p ivc filter  PAF on coumadin  Presented with supratherapeutic INR, coumadin held Coumadin management per Pharmacy   Chronic diastolic chf, on daily lasix, presented  with aki dehydration, lasix held, on ivf Plan to discharge home on lasix mwf instead of daily  BPH: on flomax   Elevated lft, unclear etiology, denies n/v, denies ab pain Will check ck, add on hepatitis panel, repeat lft in am  Patient wonder if he has a reaction to pneumonia shot, he reports has he first pneumonia shot on Monday, then developed weakness on Tuesday and Wednesday, will defer to pcp to follow up.   Code Status: full  Family Communication: patient and brother at bedside  Disposition Plan: home in 1-2 days, pending cr improvement   Consultants:  Neurology  Diabetes RN  Dietician   Procedures:  none  Antibiotics:  none   Objective: BP 130/68   Pulse 75   Temp 99.1 F (37.3 C) (Oral)   Resp 20   Ht 6' (1.829 m)   Wt 94.3 kg   SpO2 98%   BMI 28.20 kg/m   Intake/Output Summary (Last 24 hours) at 09/08/2018 1152 Last data filed at 09/08/2018 1000 Gross per 24 hour  Intake 1830 ml  Output 3000 ml  Net -1170 ml   Filed Weights   09/06/18 2324 09/07/18 1456  Weight: 96.2 kg 94.3 kg    Exam: Patient is examined daily including today on 09/08/2018, exams remain the same as of yesterday except that has changed    General:  NAD  Cardiovascular: RRR  Respiratory: CTABL  Abdomen: Soft/ND/NT, positive BS  Musculoskeletal: No Edema,  Neuro: alert, oriented , left sided weakness, left eye poor vision chronic   Data Reviewed: Basic Metabolic Panel: Recent Labs  Lab 09/06/18 2336 09/07/18 0405 09/08/18 0611  NA 134* 136  137  K 4.5 4.2 4.3  CL 103 103 105  CO2 22 20* 19*  GLUCOSE 141* 129* 129*  BUN 42* 40* 34*  CREATININE 3.94* 3.67* 3.15*  CALCIUM 9.3 9.1 9.0   Liver Function Tests: Recent Labs  Lab 09/06/18 2336 09/08/18 0611  AST 97* 52*  ALT 102* 91*  ALKPHOS 83 85  BILITOT 0.8 0.8  PROT 7.1 6.4*  ALBUMIN 3.6 3.0*   No results for input(s): LIPASE, AMYLASE in the last 168 hours. No results for input(s): AMMONIA in the  last 168 hours. CBC: Recent Labs  Lab 09/06/18 2336 09/07/18 0405 09/08/18 0611  WBC 14.2* 13.8* 13.2*  NEUTROABS 11.4*  --  10.1*  HGB 10.9* 10.4* 10.4*  HCT 36.4* 34.1* 34.6*  MCV 84.5 85.5 83.2  PLT 184 167 173   Cardiac Enzymes:   Recent Labs  Lab 09/07/18 0831  CKTOTAL 126   BNP (last 3 results) No results for input(s): BNP in the last 8760 hours.  ProBNP (last 3 results) No results for input(s): PROBNP in the last 8760 hours.  CBG: No results for input(s): GLUCAP in the last 168 hours.  No results found for this or any previous visit (from the past 240 hour(s)).   Studies: No results found.  Scheduled Meds: . aspirin  81 mg Oral Daily  . carvedilol  3.125 mg Oral BID WC  . gabapentin  200 mg Oral BID  . insulin aspart  0-9 Units Subcutaneous TID WC  . rosuvastatin  10 mg Oral QHS  . tamsulosin  0.4 mg Oral QHS  . warfarin  2.5 mg Oral ONCE-1800  . Warfarin - Pharmacist Dosing Inpatient   Does not apply q1800    Continuous Infusions: . sodium chloride 100 mL/hr at 09/08/18 3559     Time spent: 25 mins I have personally reviewed and interpreted on  09/08/2018 daily labs,  imagings as discussed above under date review session and assessment and plans.  I reviewed all nursing notes, pharmacy notes,  vitals, pertinent old records under care everywhere   I have discussed plan of care as described above with RN , patient and family on 09/08/2018   Florencia Reasons MD, PhD  [facility] Pager [phone]. If 7PM-7AM, please contact night-coverage at [email or web address], password [facility] 09/08/2018, 11:52 AM  LOS: 1 day

## 2018-09-08 NOTE — Progress Notes (Signed)
Nutrition Education Note  RD consulted for nutrition education regarding diabetes and coumadin and vitamin K.   Lab Results  Component Value Date   HGBA1C 12.4 (H) 09/08/2018    RD provided "Carbohydrate Counting for People with Diabetes" handout from the Academy of Nutrition and Dietetics. Discussed different food groups and their effects on blood sugar, emphasizing carbohydrate-containing foods. Provided list of carbohydrates and recommended serving sizes of common foods.  Discussed importance of controlled and consistent carbohydrate intake throughout the day. Provided examples of ways to balance meals/snacks and encouraged intake of high-fiber, whole grain complex carbohydrates. Teach back method used.  Expect good compliance.  Body mass index is 28.2 kg/m. Pt meets criteria for overweight based on current BMI.  Discussed the importance of keeping dietary vitamin K levels consistent. Provided handout outlining foods high in vitamin K and outlining other dietary recommendations for patients on coumadin.  Current diet order is heart healthy/carbohydrate modified, and no meal completion date has been entered. Labs and medications reviewed. No further nutrition interventions warranted at this time. RD contact information provided. If additional nutrition issues arise, please re-consult RD.  Althea Grimmer, MS, RDN, LDN Pager: [phone] Available Mondays and Fridays, 9am-2pm

## 2018-09-09 LAB — BASIC METABOLIC PANEL
Anion gap: 10 (ref 5–15)
BUN: 30 mg/dL — ABNORMAL HIGH (ref 6–20)
CO2: 20 mmol/L — AB (ref 22–32)
Calcium: 8.8 mg/dL — ABNORMAL LOW (ref 8.9–10.3)
Chloride: 109 mmol/L (ref 98–111)
Creatinine, Ser: 2.96 mg/dL — ABNORMAL HIGH (ref 0.61–1.24)
GFR calc Af Amer: 25 mL/min — ABNORMAL LOW (ref >60)
GFR calc non Af Amer: 22 mL/min — ABNORMAL LOW (ref >60)
GLUCOSE: 101 mg/dL — AB (ref 70–99)
Potassium: 4.2 mmol/L (ref 3.5–5.1)
Sodium: 139 mmol/L (ref 135–145)

## 2018-09-09 LAB — CBC WITH DIFFERENTIAL/PLATELET
Abs Immature Granulocytes: 0.05 10*3/uL (ref 0.00–0.07)
Basophils Absolute: 0 10*3/uL (ref 0.0–0.1)
Basophils Relative: 0 %
EOS ABS: 0.3 10*3/uL (ref 0.0–0.5)
Eosinophils Relative: 2 %
HEMATOCRIT: 32.8 % — AB (ref 39.0–52.0)
Hemoglobin: 9.8 g/dL — ABNORMAL LOW (ref 13.0–17.0)
Immature Granulocytes: 1 %
Lymphocytes Relative: 14 %
Lymphs Abs: 1.5 10*3/uL (ref 0.7–4.0)
MCH: 24.9 pg — ABNORMAL LOW (ref 26.0–34.0)
MCHC: 29.9 g/dL — ABNORMAL LOW (ref 30.0–36.0)
MCV: 83.2 fL (ref 80.0–100.0)
MONO ABS: 1.6 10*3/uL — AB (ref 0.1–1.0)
Monocytes Relative: 15 %
Neutro Abs: 7.4 10*3/uL (ref 1.7–7.7)
Neutrophils Relative %: 68 %
Platelets: 185 10*3/uL (ref 150–400)
RBC: 3.94 MIL/uL — ABNORMAL LOW (ref 4.22–5.81)
RDW: 13.2 % (ref 11.5–15.5)
WBC: 10.8 10*3/uL — ABNORMAL HIGH (ref 4.0–10.5)
nRBC: 0 % (ref 0.0–0.2)

## 2018-09-09 LAB — PROTEIN ELECTROPHORESIS, SERUM
A/G Ratio: 1.2 (ref 0.7–1.7)
ALPHA-1-GLOBULIN: 0.3 g/dL (ref 0.0–0.4)
Albumin ELP: 3.5 g/dL (ref 2.9–4.4)
Alpha-2-Globulin: 0.7 g/dL (ref 0.4–1.0)
Beta Globulin: 1 g/dL (ref 0.7–1.3)
GAMMA GLOBULIN: 0.9 g/dL (ref 0.4–1.8)
Globulin, Total: 3 g/dL (ref 2.2–3.9)
TOTAL PROTEIN ELP: 6.5 g/dL (ref 6.0–8.5)

## 2018-09-09 LAB — PROTIME-INR
INR: 2.51
PROTHROMBIN TIME: 26.7 s — AB (ref 11.4–15.2)

## 2018-09-09 LAB — HEPATITIS PANEL, ACUTE
HCV Ab: <0.1 s/co ratio (ref 0.0–0.9)
HEP B S AG: NEGATIVE
Hep A IgM: NEGATIVE
Hep B C IgM: NEGATIVE

## 2018-09-09 LAB — HEPATIC FUNCTION PANEL
ALT: 72 U/L — ABNORMAL HIGH (ref 0–44)
AST: 37 U/L (ref 15–41)
Albumin: 3 g/dL — ABNORMAL LOW (ref 3.5–5.0)
Alkaline Phosphatase: 92 U/L (ref 38–126)
Total Bilirubin: 0.5 mg/dL (ref 0.3–1.2)
Total Protein: 6.7 g/dL (ref 6.5–8.1)

## 2018-09-09 LAB — GLUCOSE, CAPILLARY: Glucose-Capillary: 97 mg/dL (ref 70–99)

## 2018-09-09 MED ORDER — WARFARIN SODIUM 5 MG PO TABS: 5.0000 mg | ORAL_TABLET | Freq: Once | ORAL | Status: DC

## 2018-09-09 NOTE — Progress Notes (Signed)
ANTICOAGULATION CONSULT NOTE Pharmacy Consult for warfarin Indication: PAF, lower extremity DVT and jugular vein thrombosis  No Known Allergies  Patient Measurements: Height: 6' (182.9 cm) Weight: 207 lb 14.3 oz (94.3 kg) IBW/kg (Calculated) : 77.6  Vital Signs: Temp: 98.4 F (36.9 C) (02/08 0515) Temp Source: Oral (02/08 0515) BP: 158/79 (02/08 0515) Pulse Rate: 71 (02/08 0515)  Labs: Recent Labs    09/06/18 2336 09/07/18 0405 09/07/18 0831 09/08/18 0611 09/09/18 0447  HGB 10.9* 10.4*  --  10.4* 9.8*  HCT 36.4* 34.1*  --  34.6* 32.8*  PLT 184 167  --  173 185  APTT 49*  --   --   --   --   LABPROT 31.4* 33.9*  --  31.1* 26.7*  INR 3.09 3.41  --  3.05 2.51  CREATININE 3.94* 3.67*  --  3.15* 2.96*  CKTOTAL  --   --  126  --   --     Estimated Creatinine Clearance: 31.6 mL/min (A) (by C-G formula based on SCr of 2.96 mg/dL (H)).  Assessment: 63 y.o. M presents with L-sided weakness. CT shows no acute intracranial process. Pt on coumadin PTA for PAF, lower extremity DVT and jugular vein thrombosis. Admission INR 3.09 (slightly supratherapeutic).  INR today = 2.51 (warfarin held 2/6 and resumed 2/7). CBC stable.  Home coumadin 10mg  daily with last dose PTA taken 2/5 2030  Goal of Therapy:  INR 2-3 Monitor platelets by anticoagulation protocol: Yes   Plan:  Warfarin 5 mg po x 1 Daily INR and CBC Monitor for signs and symptoms of bleeding  Vertis Kelch, PharmD PGY1 Pharmacy Resident Phone [phone] 09/09/2018       8:59 AM

## 2018-09-09 NOTE — Progress Notes (Signed)
Patient discharge home and instructions reviewed with patient. Patient awaiting ride in the room IV removed.

## 2018-09-09 NOTE — Discharge Instructions (Signed)
Acute Kidney Injury, Adult ° °Acute kidney injury is a sudden worsening of kidney function. The kidneys are organs that have several jobs. They filter the blood to remove waste products and extra fluid. They also maintain a healthy balance of minerals and hormones in the body, which helps control blood pressure and keep bones strong. With this condition, your kidneys do not do their jobs as well as they should. °This condition ranges from mild to severe. Over time it may develop into long-lasting (chronic) kidney disease. Early detection and treatment may prevent acute kidney injury from developing into a chronic condition. °What are the causes? °Common causes of this condition include: °· A problem with blood flow to the kidneys. This may be caused by: °? Low blood pressure (hypotension) or shock. °? Blood loss. °? Heart and blood vessel (cardiovascular) disease. °? Severe burns. °? Liver disease. °· Direct damage to the kidneys. This may be caused by: °? Certain medicines. °? A kidney infection. °? Poisoning. °? Being around or in contact with toxic substances. °? A surgical wound. °? A hard, direct hit to the kidney area. °· A sudden blockage of urine flow. This may be caused by: °? Cancer. °? Kidney stones. °? An enlarged prostate in males. °What are the signs or symptoms? °Symptoms of this condition may not be obvious until the condition becomes severe. Symptoms of this condition can include: °· Tiredness (lethargy), or difficulty staying awake. °· Nausea or vomiting. °· Swelling (edema) of the face, legs, ankles, or feet. °· Problems with urination, such as: °? Abdominal pain, or pain along the side of your stomach (flank). °? Decreased urine production. °? Decrease in the force of urine flow. °· Muscle twitches and cramps, especially in the legs. °· Confusion or trouble concentrating. °· Loss of appetite. °· Fever. °How is this diagnosed? °This condition may be diagnosed with tests, including: °· Blood  tests. °· Urine tests. °· Imaging tests. °· A test in which a sample of tissue is removed from the kidneys to be examined under a microscope (kidney biopsy). °How is this treated? °Treatment for this condition depends on the cause and how severe the condition is. In mild cases, treatment may not be needed. The kidneys may heal on their own. In more severe cases, treatment will involve: °· Treating the cause of the kidney injury. This may involve changing any medicines you are taking or adjusting your dosage. °· Fluids. You may need specialized IV fluids to balance your body's needs. °· Having a catheter placed to drain urine and prevent blockages. °· Preventing problems from occurring. This may mean avoiding certain medicines or procedures that can cause further injury to the kidneys. °In some cases treatment may also require: °· A procedure to remove toxic wastes from the body (dialysis or continuous renal replacement therapy - CRRT). °· Surgery. This may be done to repair a torn kidney, or to remove the blockage from the urinary system. °Follow these instructions at home: °Medicines °· Take over-the-counter and prescription medicines only as told by your health care provider. °· Do not take any new medicines without your health care provider's approval. Many medicines can worsen your kidney damage. °· Do not take any vitamin and mineral supplements without your health care provider's approval. Many nutritional supplements can worsen your kidney damage. °Lifestyle °· If your health care provider prescribed changes to your diet, follow them. You may need to decrease the amount of protein you eat. °· Achieve and maintain a healthy   weight. If you need help with this, ask your health care provider. °· Start or continue an exercise plan. Try to exercise at least 30 minutes a day, 5 days a week. °· Do not use any tobacco products, such as cigarettes, chewing tobacco, and e-cigarettes. If you need help quitting, ask your  health care provider. °General instructions °· Keep track of your blood pressure. Report changes in your blood pressure as told by your health care provider. °· Stay up to date with immunizations. Ask your health care provider which immunizations you need. °· Keep all follow-up visits as told by your health care provider. This is important. °Where to find more information °· [employer] of Kidney Patients: www.aakp.org °· [employer] Kid[employer]: www.kidney.org °· [employer]: www.akfinc.org °· [employer] [facility]: °? www.lifeoptions.org °? www.kidneyschool.org °Contact a health care provider if: °· Your symptoms get worse. °· You develop new symptoms. °Get help right away if: °· You develop symptoms of worsening kidney disease, which include: °? Headaches. °? Abnormally dark or light skin. °? Easy bruising. °? Frequent hiccups. °? Chest pain. °? Shortness of breath. °? End of menstruation in women. °? Seizures. °? Confusion or altered mental status. °? Abdominal or back pain. °? Itchiness. °· You have a fever. °· Your body is producing less urine. °· You have pain or bleeding when you urinate. °Summary °· Acute kidney injury is a sudden worsening of kidney function. °· Acute kidney injury can be caused by problems with blood flow to the kidneys, direct damage to the kidneys, and sudden blockage of urine flow. °· Symptoms of this condition may not be obvious until it becomes severe. Symptoms may include edema, lethargy, confusion, nausea or vomiting, and problems passing urine. °· This condition can usually be diagnosed with blood tests, urine tests, and imaging tests. Sometimes a kidney biopsy is done to diagnose this condition. °· Treatment for this condition often involves treating the underlying cause. It is treated with fluids, medicines, dialysis, diet changes, or surgery. °This information is not intended to replace advice given to you by your health care provider. Make  sure you discuss any questions you have with your health care provider. °Document Released: 02/01/2011 Document Revised: 11/18/2016 Document Reviewed: 07/09/2016 °[employer] Interactive Patient Education © 2019 [employer] ° °

## 2018-09-09 NOTE — Progress Notes (Signed)
MD at bedside during morning report, Discharging patient with orders to follow. Patient stable, assessment benign awaiting orders for discharge

## 2018-09-09 NOTE — Discharge Summary (Signed)
PATIENT DETAILS Name: Shane Murphy Age: 63 y.o. Sex: male Date of Birth: 02-Feb-1956 MRN: [id number]. Admitting Physician: Etta Quill, DO OMV:[facility], Memory Argue, MD  Admit Date: 09/06/2018 Discharge date: 09/09/2018  Recommendations for Outpatient Follow-up:  1. Follow up with PCP in 1-2 weeks 2. Please obtain BMP/CBC in one week   Admitted From:  Home  Disposition: [place]: No  Equipment/Devices: None  Discharge Condition: Stable  CODE STATUS: FULL CODE  Diet recommendation:  Heart Healthy / Carb Modified  Brief Summary: See H&P, Labs, Consult and Test reports for all details in brief, patient is a 63 year old male with history of aortic valve replacement, prior CVA, PAF on Coumadin, chronic diastolic heart failure, CKD stage IV presenting with acute kidney injury on chronic kidney disease stage IV and worsening left-sided weakness.  See below for further details  Brief Hospital Course: Worsening left-sided weakness: Not felt to have a acute CVA-MRI brain negative.  Per neurology-this is likely recrudescence of his previous symptoms in the setting of physiological stressors/AKI.  Evaluated by PT-no further services recommended at this time.  AKI on CKD stage IV: Suspect this is hemodynamically mediated, UA without proteinuria, renal ultrasound without any hydronephrosis.  Reviewed labs in care everywhere-has creatinine levels in the past has been from around 2.0-2.5 range.  He was provided supportive care, creatinine on presentation was 3.94-with supportive care, creatinine has come down to 2.9.  Suspect that it will slowly continue to improve and be close to his usual baseline.  Hold Lasix for a few more days continue Coumadin have asked patient and family to get a repeat chemistry panel in 1 week.  Patient was instructed to avoid nephrotoxic agents including NSAIDs.  History of PAF: Rate controlled with Coreg, continue Coumadin-follow-up with PCP for  INR checks.  INR therapeutic at 2.5 on discharge.  Chronic diastolic heart failure: Euvolemic-due to AKI-continue to hold Lasix for a few more days-resume after seen by PCP.  History of bioprosthetic aortic valve replacement: Continue follow-up with cardiology  History of perioperative CVA: Left sided weakness and poor vision in left eye.  MRI of the brain did not show any acute findings.  See above.  Insulin-dependent DM-2: BG stable-resume usual home insulin regimen  Procedures/Studies: None  Discharge Diagnoses:  Principal Problem:   Acute on chronic kidney failure (HCC) Active Problems:   Left hemiparesis (HCC)   PAF (paroxysmal atrial fibrillation) (HCC)   HTN (hypertension)   Supratherapeutic INR   Insulin dependent diabetes mellitus (HCC)   FTT (failure to thrive) in adult   Discharge Instructions:  Activity:  As tolerated    Discharge Instructions    Diet - low sodium heart healthy   Complete by:  As directed    Diet Carb Modified   Complete by:  As directed    Discharge instructions   Complete by:  As directed    Follow with Primary MD  Terrial Rhodes, MD in 1 week for a repeat BMET  Please get a complete blood count and chemistry panel checked by your Primary MD at your next visit, and again as instructed by your Primary MD.  Get Medicines reviewed and adjusted: Please take all your medications with you for your next visit with your Primary MD  Laboratory/radiological data: Please request your Primary MD to go over all hospital tests and procedure/radiological results at the follow up, please ask your Primary MD to get all Hospital records sent to his/her office.  In some cases, they will be blood work, cultures and biopsy results pending at the time of your discharge. Please request that your primary care M.D. follows up on these results.  Also Note the following: If you experience worsening of your admission symptoms, develop shortness of breath, life  threatening emergency, suicidal or homicidal thoughts you must seek medical attention immediately by calling 911 or calling your MD immediately  if symptoms less severe.  You must read complete instructions/literature along with all the possible adverse reactions/side effects for all the Medicines you take and that have been prescribed to you. Take any new Medicines after you have completely understood and accpet all the possible adverse reactions/side effects.   Do not drive when taking Pain medications or sleeping medications (Benzodaizepines)  Do not take more than prescribed Pain, Sleep and Anxiety Medications. It is not advisable to combine anxiety,sleep and pain medications without talking with your primary care practitioner  Special Instructions: If you have smoked or chewed Tobacco  in the last 2 yrs please stop smoking, stop any regular Alcohol  and or any Recreational drug use.  Wear Seat belts while driving.  Please note: You were cared for by a hospitalist during your hospital stay. Once you are discharged, your primary care physician will handle any further medical issues. Please note that NO REFILLS for any discharge medications will be authorized once you are discharged, as it is imperative that you return to your primary care physician (or establish a relationship with a primary care physician if you do not have one) for your post hospital discharge needs so that they can reassess your need for medications and monitor your lab values.   Increase activity slowly   Complete by:  As directed      Allergies as of 09/09/2018   No Known Allergies     Medication List    STOP taking these medications   furosemide 20 MG tablet Commonly known as:  LASIX     TAKE these medications   acetaminophen 500 MG tablet Commonly known as:  TYLENOL Take 500 mg by mouth every 6 (six) hours as needed (for pain or headaches).   aspirin 81 MG chewable tablet Chew 81 mg by mouth daily.     carvedilol 3.125 MG tablet Commonly known as:  COREG Take 3.125 mg by mouth 2 (two) times daily with a meal.   gabapentin 100 MG capsule Commonly known as:  NEURONTIN Take 200 mg by mouth 2 (two) times daily.   HUMALOG KWIKPEN 100 UNIT/ML KwikPen Generic drug:  insulin lispro Inject 6 Units into the skin 2 (two) times daily as needed (for a BGL of 150 or greater).   rosuvastatin 10 MG tablet Commonly known as:  CRESTOR Take 10 mg by mouth at bedtime.   tamsulosin 0.4 MG Caps capsule Commonly known as:  FLOMAX Take 0.4 mg by mouth at bedtime.   warfarin 10 MG tablet Commonly known as:  COUMADIN Take 10 mg by mouth at bedtime.      Follow-up Information    Terrial Rhodes, MD Follow up in 1 week(s).   Why:  hospital discharge follow up, pcp to repeat cbc/bmp at follow up. pcp to refer patient to nephrology for chronic kidney disease. Contact information: [address] [place] [address] [phone]        follow up with coumadin clinic in three days. Follow up.          No Known Allergies  Consultations:  neurology   Other Procedures/Studies: Dg Chest 2 View  Result Date: 09/06/2018 CLINICAL DATA:  Weakness after getting flu shot. EXAM: CHEST - 2 VIEW COMPARISON:  None. FINDINGS: Postoperative changes in the mediastinum. Surgical clips in the right upper chest. Postoperative cerclage wire with old fracture deformity of the distal left clavicle. Heart size and pulmonary vascularity are normal. Lungs appear clear and expanded. No airspace disease or consolidation. No blunting of costophrenic angles. No pneumothorax. Mediastinal contours appear intact. Degenerative changes in the spine. IMPRESSION: No evidence of active pulmonary disease. Electronically Signed   By: Lucienne Capers M.D.   On: 09/06/2018 23:53   Ct Head Wo Contrast  Result Date: 09/07/2018 CLINICAL DATA:  Worsening weakness of the left side EXAM: CT HEAD WITHOUT CONTRAST TECHNIQUE:  Contiguous axial images were obtained from the base of the skull through the vertex without intravenous contrast. COMPARISON:  08/07/2018 FINDINGS: Brain: No evidence of acute infarction, hemorrhage, hydrocephalus, extra-axial collection or mass lesion/mass effect. Old right MCA territory infarct with encephalomalacia. Old small left occipital lobe infarct. Vascular: No hyperdense vessel or unexpected calcification. Skull: No osseous abnormality. Sinuses/Orbits: Visualized paranasal sinuses are clear. Visualized mastoid sinuses are clear. Visualized orbits demonstrate no focal abnormality. Other: None IMPRESSION: No acute intracranial pathology. Electronically Signed   By: Kathreen Devoid   On: 09/07/2018 00:13   Mr Brain Wo Contrast  Result Date: 09/07/2018 CLINICAL DATA:  Initial evaluation for increased left-sided weakness. EXAM: MRI HEAD WITHOUT CONTRAST TECHNIQUE: Multiplanar, multiecho pulse sequences of the brain and surrounding structures were obtained without intravenous contrast. COMPARISON:  Prior CT from 09/06/2018 FINDINGS: Brain: Generalized age-related cerebral atrophy. Patchy T2/FLAIR hyperintensity within the periventricular deep white matter both cerebral hemispheres, most like related chronic microvascular disease, mild in nature. Extensive encephalomalacia with gliosis seen involving the right frontal and parietal lobes as well as the right temporal occipital region, compatible with remote right MCA-MCA/PCA watershed territory infarcts. Additional smaller cortical infarct noted at the left occipital lobe, compatible with remote left PCA territory infarct. Scattered chronic hemosiderin staining within these regions compatible with chronic blood products and/or laminar necrosis. Associated wallerian degeneration at the right cerebral peduncle extending into the brainstem. No abnormal foci of restricted diffusion to suggest acute or subacute ischemia. Gray-white matter differentiation otherwise  maintained. No evidence for acute intracranial hemorrhage. No mass lesion, midline shift or mass effect. No hydrocephalus. No extra-axial fluid collection. Pituitary gland within normal limits. Vascular: Major intracranial vascular flow voids maintained. Skull and upper cervical spine: Craniocervical junction normal. Bone marrow signal intensity within normal limits. No scalp soft tissue abnormality. Sinuses/Orbits: Globes and orbital soft tissues within normal limits. Paranasal sinuses are largely clear. Trace opacity bilateral mastoid air cells, of doubtful significance. Inner ear structures grossly normal. Other: None. IMPRESSION: 1. No acute intracranial abnormality. 2. 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 3. Underlying age-related cerebral atrophy with mild chronic small vessel ischemic disease. Electronically Signed   By: Jeannine Boga M.D.   On: 09/07/2018 01:54   US Renal  Result Date: 09/07/2018 CLINICAL DATA:  Acute renal failure EXAM: RENAL / URINARY TRACT ULTRASOUND COMPLETE COMPARISON:  None. FINDINGS: Right Kidney: Renal measurements: 12.3 x 4.1 x 5.4 cm = volume: 146 mL . Echogenicity within normal limits. No mass or hydronephrosis visualized. Left Kidney: Renal measurements: 11.3 x 6 x 5.9 cm = volume: 212 mL. Echogenicity within normal limits. No mass or hydronephrosis visualized. Bladder: No bladder wall thickening or filling defects. Enlarged and lobular  appearance of the prostate gland. Prostate measures 5.6 x 5 x 3.9 cm. Prostatic impression on the bladder base. IMPRESSION: Normal ultrasound appearance of the kidneys. No hydronephrosis. Normal appearance of the bladder. Enlarged prostate gland. Electronically Signed   By: Lucienne Capers M.D.   On: 09/07/2018 03:48      TODAY-DAY OF DISCHARGE:  Subjective:   Shane Murphy today has no headache,no chest abdominal pain,no new weakness tingling or numbness, feels much better wants to go home today.   Objective:    Blood pressure (!) 158/79, pulse 71, temperature 98.4 F (36.9 C), temperature source Oral, resp. rate 18, height 6' (1.829 m), weight 94.3 kg, SpO2 100 %.  Intake/Output Summary (Last 24 hours) at 09/09/2018 0901 Last data filed at 09/09/2018 0721 Gross per 24 hour  Intake 1080 ml  Output 4451 ml  Net -3371 ml   Filed Weights   09/06/18 2324 09/07/18 1456  Weight: 96.2 kg 94.3 kg    Exam: Awake Alert, Oriented *3, No new F.N deficits, Normal affect [place].AT,PERRAL Supple Neck,No JVD, No cervical lymphadenopathy appriciated.  Symmetrical Chest wall movement, Good air movement bilaterally, CTAB RRR,No Gallops,Rubs or new Murmurs, No Parasternal Heave +ve B.Sounds, Abd Soft, Non tender, No organomegaly appriciated, No rebound -guarding or rigidity. No Cyanosis, Clubbing or edema, No new Rash or bruise   PERTINENT RADIOLOGIC STUDIES: Dg Chest 2 View  Result Date: 09/06/2018 CLINICAL DATA:  Weakness after getting flu shot. EXAM: CHEST - 2 VIEW COMPARISON:  None. FINDINGS: Postoperative changes in the mediastinum. Surgical clips in the right upper chest. Postoperative cerclage wire with old fracture deformity of the distal left clavicle. Heart size and pulmonary vascularity are normal. Lungs appear clear and expanded. No airspace disease or consolidation. No blunting of costophrenic angles. No pneumothorax. Mediastinal contours appear intact. Degenerative changes in the spine. IMPRESSION: No evidence of active pulmonary disease. Electronically Signed   By: Lucienne Capers M.D.   On: 09/06/2018 23:53   Ct Head Wo Contrast  Result Date: 09/07/2018 CLINICAL DATA:  Worsening weakness of the left side EXAM: CT HEAD WITHOUT CONTRAST TECHNIQUE: Contiguous axial images were obtained from the base of the skull through the vertex without intravenous contrast. COMPARISON:  08/07/2018 FINDINGS: Brain: No evidence of acute infarction, hemorrhage, hydrocephalus, extra-axial collection or mass lesion/mass  effect. Old right MCA territory infarct with encephalomalacia. Old small left occipital lobe infarct. Vascular: No hyperdense vessel or unexpected calcification. Skull: No osseous abnormality. Sinuses/Orbits: Visualized paranasal sinuses are clear. Visualized mastoid sinuses are clear. Visualized orbits demonstrate no focal abnormality. Other: None IMPRESSION: No acute intracranial pathology. Electronically Signed   By: Kathreen Devoid   On: 09/07/2018 00:13   Mr Brain Wo Contrast  Result Date: 09/07/2018 CLINICAL DATA:  Initial evaluation for increased left-sided weakness. EXAM: MRI HEAD WITHOUT CONTRAST TECHNIQUE: Multiplanar, multiecho pulse sequences of the brain and surrounding structures were obtained without intravenous contrast. COMPARISON:  Prior CT from 09/06/2018 FINDINGS: Brain: Generalized age-related cerebral atrophy. Patchy T2/FLAIR hyperintensity within the periventricular deep white matter both cerebral hemispheres, most like related chronic microvascular disease, mild in nature. Extensive encephalomalacia with gliosis seen involving the right frontal and parietal lobes as well as the right temporal occipital region, compatible with remote right MCA-MCA/PCA watershed territory infarcts. Additional smaller cortical infarct noted at the left occipital lobe, compatible with remote left PCA territory infarct. Scattered chronic hemosiderin staining within these regions compatible with chronic blood products and/or laminar necrosis. Associated wallerian degeneration at the right cerebral peduncle extending  into the brainstem. No abnormal foci of restricted diffusion to suggest acute or subacute ischemia. Gray-white matter differentiation otherwise maintained. No evidence for acute intracranial hemorrhage. No mass lesion, midline shift or mass effect. No hydrocephalus. No extra-axial fluid collection. Pituitary gland within normal limits. Vascular: Major intracranial vascular flow voids maintained. Skull  and upper cervical spine: Craniocervical junction normal. Bone marrow signal intensity within normal limits. No scalp soft tissue abnormality. Sinuses/Orbits: Globes and orbital soft tissues within normal limits. Paranasal sinuses are largely clear. Trace opacity bilateral mastoid air cells, of doubtful significance. Inner ear structures grossly normal. Other: None. IMPRESSION: 1. No acute intracranial abnormality. 2. 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 3. Underlying age-related cerebral atrophy with mild chronic small vessel ischemic disease. Electronically Signed   By: Jeannine Boga M.D.   On: 09/07/2018 01:54   US Renal  Result Date: 09/07/2018 CLINICAL DATA:  Acute renal failure EXAM: RENAL / URINARY TRACT ULTRASOUND COMPLETE COMPARISON:  None. FINDINGS: Right Kidney: Renal measurements: 12.3 x 4.1 x 5.4 cm = volume: 146 mL . Echogenicity within normal limits. No mass or hydronephrosis visualized. Left Kidney: Renal measurements: 11.3 x 6 x 5.9 cm = volume: 212 mL. Echogenicity within normal limits. No mass or hydronephrosis visualized. Bladder: No bladder wall thickening or filling defects. Enlarged and lobular appearance of the prostate gland. Prostate measures 5.6 x 5 x 3.9 cm. Prostatic impression on the bladder base. IMPRESSION: Normal ultrasound appearance of the kidneys. No hydronephrosis. Normal appearance of the bladder. Enlarged prostate gland. Electronically Signed   By: Lucienne Capers M.D.   On: 09/07/2018 03:48     PERTINENT LAB RESULTS: CBC: Recent Labs    09/08/18 0611 09/09/18 0447  WBC 13.2* 10.8*  HGB 10.4* 9.8*  HCT 34.6* 32.8*  PLT 173 185   CMET CMP     Component Value Date/Time   NA 139 09/09/2018 0447   K 4.2 09/09/2018 0447   CL 109 09/09/2018 0447   CO2 20 (L) 09/09/2018 0447   GLUCOSE 101 (H) 09/09/2018 0447   BUN 30 (H) 09/09/2018 0447   CREATININE 2.96 (H) 09/09/2018 0447   CALCIUM 8.8 (L) 09/09/2018 0447   PROT 6.7 09/09/2018  0447   ALBUMIN 3.0 (L) 09/09/2018 0447   AST 37 09/09/2018 0447   ALT 72 (H) 09/09/2018 0447   ALKPHOS 92 09/09/2018 0447   BILITOT 0.5 09/09/2018 0447   GFRNONAA 22 (L) 09/09/2018 0447   GFRAA 25 (L) 09/09/2018 0447    GFR Estimated Creatinine Clearance: 31.6 mL/min (A) (by C-G formula based on SCr of 2.96 mg/dL (H)). No results for input(s): LIPASE, AMYLASE in the last 72 hours. Recent Labs    09/07/18 0831  CKTOTAL 126   Invalid input(s): POCBNP No results for input(s): DDIMER in the last 72 hours. Recent Labs    09/08/18 0611  HGBA1C 12.4*   No results for input(s): CHOL, HDL, LDLCALC, TRIG, CHOLHDL, LDLDIRECT in the last 72 hours. No results for input(s): TSH, T4TOTAL, T3FREE, THYROIDAB in the last 72 hours.  Invalid input(s): FREET3 No results for input(s): VITAMINB12, FOLATE, FERRITIN, TIBC, IRON, RETICCTPCT in the last 72 hours. Coags: Recent Labs    09/08/18 0611 09/09/18 0447  INR 3.05 2.51   Microbiology: No results found for this or any previous visit (from the past 240 hour(s)).  FURTHER DISCHARGE INSTRUCTIONS:  Get Medicines reviewed and adjusted: Please take all your medications with you for your next visit with your Primary MD  Laboratory/radiological data: Please request your Primary MD to go over all hospital tests and procedure/radiological results at the follow up, please ask your Primary MD to get all Hospital records sent to his/her office.  In some cases, they will be blood work, cultures and biopsy results pending at the time of your discharge. Please request that your primary care M.D. goes through all the records of your hospital data and follows up on these results.  Also Note the following: If you experience worsening of your admission symptoms, develop shortness of breath, life threatening emergency, suicidal or homicidal thoughts you must seek medical attention immediately by calling 911 or calling your MD immediately  if symptoms less  severe.  You must read complete instructions/literature along with all the possible adverse reactions/side effects for all the Medicines you take and that have been prescribed to you. Take any new Medicines after you have completely understood and accpet all the possible adverse reactions/side effects.   Do not drive when taking Pain medications or sleeping medications (Benzodaizepines)  Do not take more than prescribed Pain, Sleep and Anxiety Medications. It is not advisable to combine anxiety,sleep and pain medications without talking with your primary care practitioner  Special Instructions: If you have smoked or chewed Tobacco  in the last 2 yrs please stop smoking, stop any regular Alcohol  and or any Recreational drug use.  Wear Seat belts while driving.  Please note: You were cared for by a hospitalist during your hospital stay. Once you are discharged, your primary care physician will handle any further medical issues. Please note that NO REFILLS for any discharge medications will be authorized once you are discharged, as it is imperative that you return to your primary care physician (or establish a relationship with a primary care physician if you do not have one) for your post hospital discharge needs so that they can reassess your need for medications and monitor your lab values.  Total Time spent coordinating discharge including counseling, education and face to face time equals 35 minutes.  SignedOren Binet 09/09/2018 9:01 AM

## 2018-09-12 LAB — UPEP/UIFE/LIGHT CHAINS/TP, 24-HR UR
% BETA, Urine: 30 %
ALPHA 1 URINE: 1.4 %
Albumin, U: 8.2 %
Alpha 2, Urine: 40.3 %
Free Kappa Lt Chains,Ur: 273.83 mg/L — ABNORMAL HIGH (ref 0.63–113.79)
Free Kappa/Lambda Ratio: 10.75 (ref 1.03–31.76)
Free Lambda Lt Chains,Ur: 25.47 mg/L — ABNORMAL HIGH (ref 0.47–11.77)
GAMMA GLOBULIN URINE: 20.1 %
M-SPIKE %, Urine: 3.9 % — ABNORMAL HIGH
M-SPIKE, MG/24 HR: 0 mg/(24.h)
Total Protein, Urine-Ur/day: 0 mg/24 hr — ABNORMAL LOW (ref 30–150)
Total Protein, Urine: 14.5 mg/dL

## 2020-04-16 IMAGING — MR MR HEAD W/O CM
12 of 13 series · 43 of 48 positions shown · non-contrast
Comparison: Prior CT from 09/06/2018

CLINICAL DATA: Initial evaluation for increased left-sided
weakness.

EXAM:
MRI HEAD WITHOUT CONTRAST
TECHNIQUE: Multiplanar, multiecho pulse sequences of the brain and surrounding
structures were obtained without intravenous contrast.

[Series 5: DWI · axial · 3.0mm · 0.88mm/px · z∈[-30,+107]mm · 6 of 94 slices shown (1 of 4)]
[im 1/94]
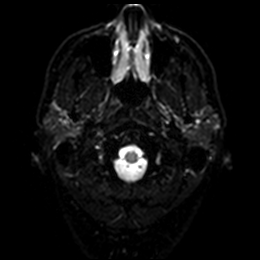
[im 19/94]
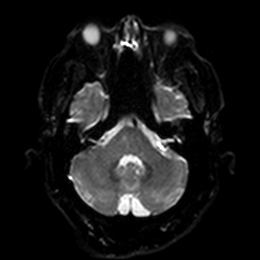
[im 38/94]
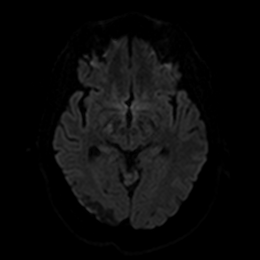
[im 56/94]
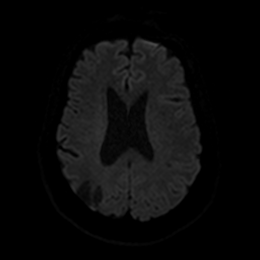
[im 75/94]
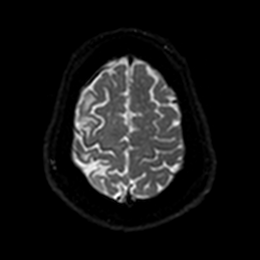
[im 94/94]
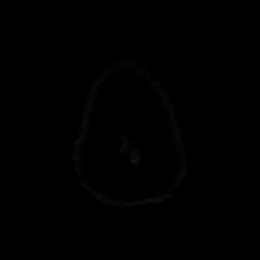

[Series 6: DWI · axial · 3.0mm · 0.88mm/px · z∈[-30,+107]mm · 2 of 47 slices shown (2 of 4)]
[im 1/47]
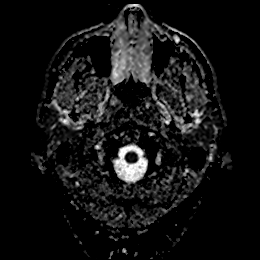
[im 47/47]
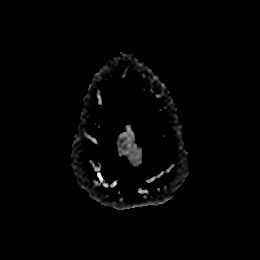

[Series 7: DWI · coronal · 4.0mm · 0.88mm/px · 5 of 74 slices shown (3 of 4)]
[im 1/74]
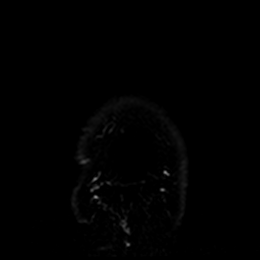
[im 19/74]
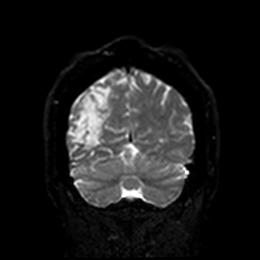
[im 37/74]
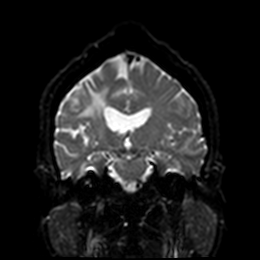
[im 55/74]
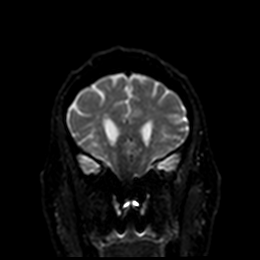
[im 74/74]
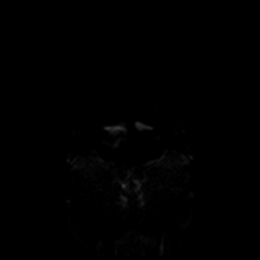

[Series 8: DWI · coronal · 4.0mm · 0.88mm/px · 3 of 37 slices shown (4 of 4)]
[im 1/37]
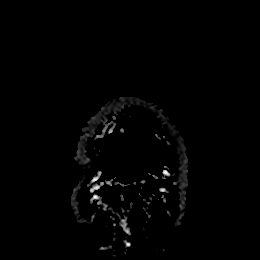
[im 19/37]
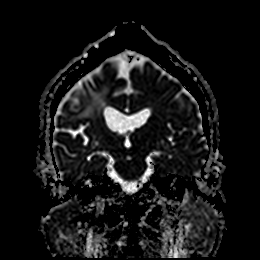
[im 37/37]
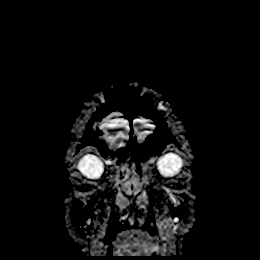

[Series 9: T1 · sagittal · 5.0mm · 0.75mm/px · 2 of 24 slices shown]
[im 1/24]
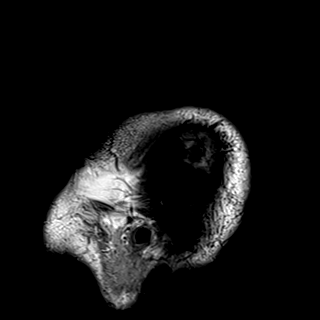
[im 24/24]
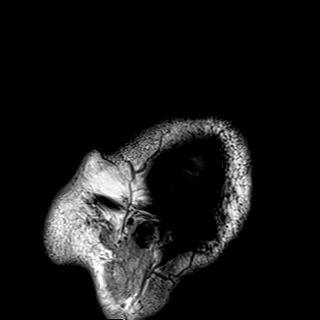

[Series 10: T2 · axial · 5.0mm · 0.72mm/px · z∈[-33,+110]mm · 2 of 25 slices shown (1 of 2)]
[im 1/25]
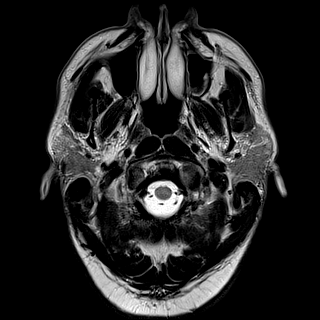
[im 25/25]
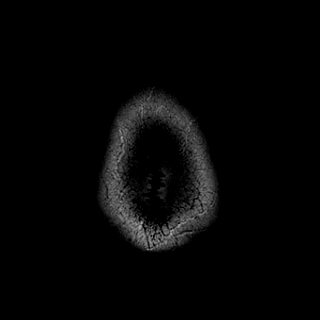

[Series 11: FLAIR · axial · 5.0mm · 0.45mm/px · z∈[-36,+107]mm · 2 of 25 slices shown]
[im 1/25]
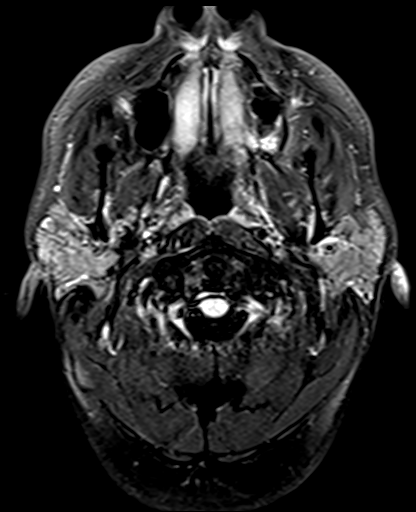
[im 25/25]
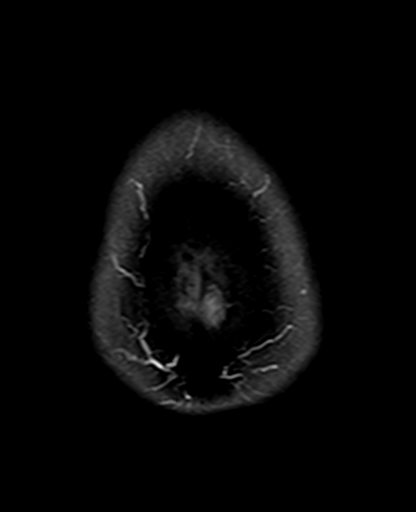

[Series 12: mag_images · axial · 3.0mm · 0.90mm/px · z∈[-50,+138]mm · 5 of 64 slices shown]
[im 1/64]
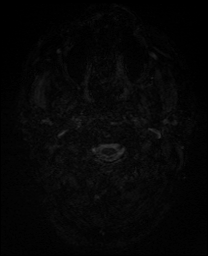
[im 16/64]
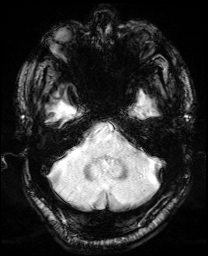
[im 32/64]
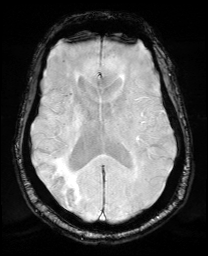
[im 48/64]
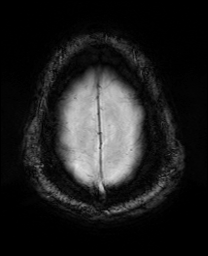
[im 64/64]
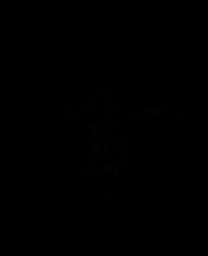

[Series 13: pha_images · axial · 3.0mm · 0.90mm/px · z∈[-50,+138]mm · 5 of 64 slices shown]
[im 1/64]
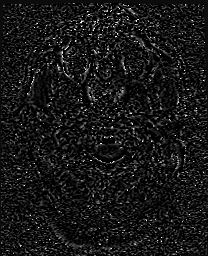
[im 16/64]
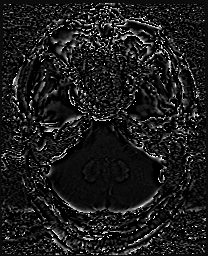
[im 32/64]
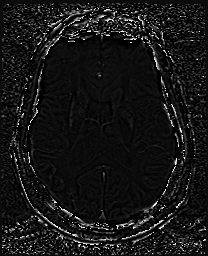
[im 48/64]
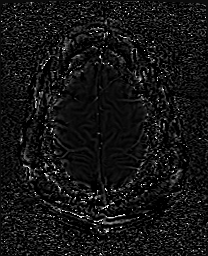
[im 64/64]
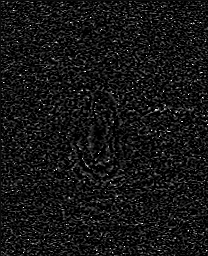

[Series 14: swi_images · axial · 3.0mm · 0.90mm/px · z∈[-50,+138]mm · 5 of 64 slices shown]
[im 1/64]
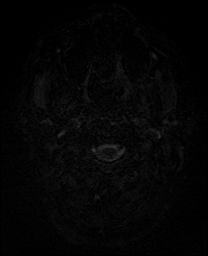
[im 16/64]
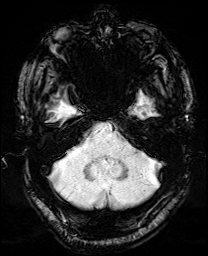
[im 32/64]
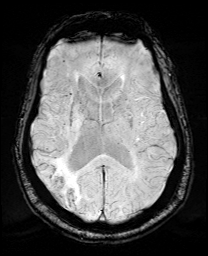
[im 48/64]
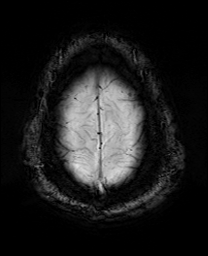
[im 64/64]
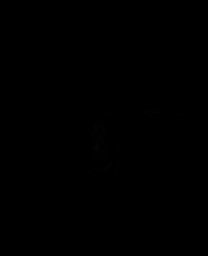

[Series 15: mip_images(sw) · axial · 24.0mm · 0.90mm/px · z∈[-40,+127]mm · 4 of 57 slices shown]
[im 1/57]
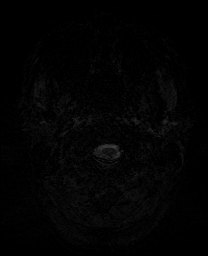
[im 19/57]
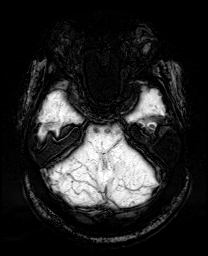
[im 38/57]
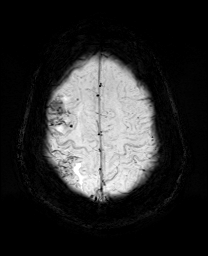
[im 57/57]
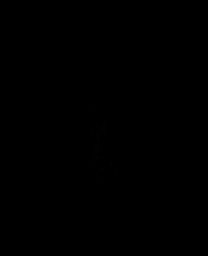

[Series 17: T2 · coronal · 5.0mm · 0.34mm/px · 2 of 30 slices shown (2 of 2)]
[im 1/30]
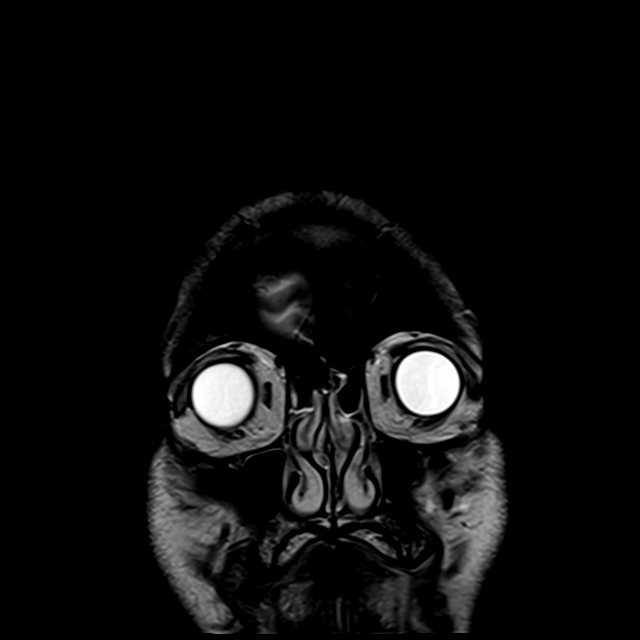
[im 30/30]
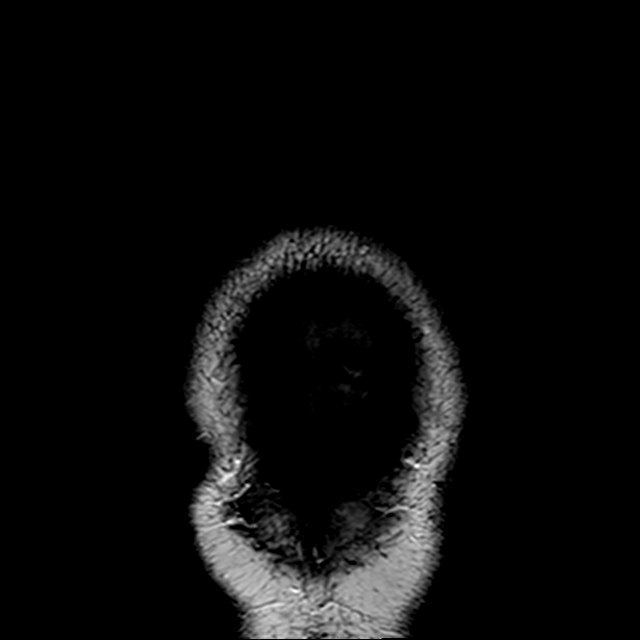

[43 of 48 positions shown; findings below may reference images not displayed]

FINDINGS: Brain: Generalized age-related cerebral atrophy. Patchy T2/FLAIR
hyperintensity within the periventricular deep white matter both
cerebral hemispheres, most like related chronic microvascular
disease, mild in nature. Extensive encephalomalacia with gliosis
seen involving the right frontal and parietal lobes as well as the
right temporal occipital region, compatible with remote right
MCA-MCA/PCA watershed territory infarcts. Additional smaller
cortical infarct noted at the left occipital lobe, compatible with
remote left PCA territory infarct. Scattered chronic hemosiderin
staining within these regions compatible with chronic blood products
and/or laminar necrosis. Associated wallerian degeneration at the
right cerebral peduncle extending into the brainstem.

No abnormal foci of restricted diffusion to suggest acute or
subacute ischemia. Gray-white matter differentiation otherwise
maintained. No evidence for acute intracranial hemorrhage.

No mass lesion, midline shift or mass effect. No hydrocephalus. No
extra-axial fluid collection. Pituitary gland within normal limits.

Vascular: Major intracranial vascular flow voids maintained.

Skull and upper cervical spine: Craniocervical junction normal. Bone
marrow signal intensity within normal limits. No scalp soft tissue
abnormality.

Sinuses/Orbits: Globes and orbital soft tissues within normal
limits. Paranasal sinuses are largely clear. Trace opacity bilateral
mastoid air cells, of doubtful significance. Inner ear structures
grossly normal.

Other: None.
IMPRESSION: 1. No acute intracranial abnormality.
2. Chronic right MCA and PCA territory infarcts as above.
3. Underlying age-related cerebral atrophy with mild chronic small
vessel ischemic disease.

## 2021-06-02 DEATH — deceased
